# Patient Record
Sex: Male | Born: 1955 | Race: White | Hispanic: No | Marital: Married | Smoking: Never smoker
Health system: Southern US, Community
[De-identification: ages and names within clinical notes are randomized; demographics above are authoritative.]

## PROBLEM LIST (undated history)

## (undated) ENCOUNTER — Emergency Department (HOSPITAL_COMMUNITY): Discharge status: Against Medical Advice | Payer: Managed Care, Other (non HMO)

## (undated) DIAGNOSIS — IMO0002 Reserved for concepts with insufficient information to code with codable children: Secondary | ICD-10-CM

## (undated) DIAGNOSIS — E785 Hyperlipidemia, unspecified: Secondary | ICD-10-CM

## (undated) DIAGNOSIS — K469 Unspecified abdominal hernia without obstruction or gangrene: Secondary | ICD-10-CM

## (undated) DIAGNOSIS — G4733 Obstructive sleep apnea (adult) (pediatric): Secondary | ICD-10-CM

## (undated) DIAGNOSIS — C801 Malignant (primary) neoplasm, unspecified: Secondary | ICD-10-CM

## (undated) HISTORY — DX: Reserved for concepts with insufficient information to code with codable children: IMO0002

## (undated) HISTORY — DX: Hyperlipidemia, unspecified: E78.5

## (undated) HISTORY — DX: Unspecified abdominal hernia without obstruction or gangrene: K46.9

## (undated) HISTORY — DX: Malignant (primary) neoplasm, unspecified: C80.1

## (undated) HISTORY — DX: Obstructive sleep apnea (adult) (pediatric): G47.33

---

## 2002-03-26 ENCOUNTER — Ambulatory Visit (HOSPITAL_COMMUNITY): Admission: RE | Admit: 2002-03-26 | Discharge: 2002-03-26 | Payer: Self-pay | Admitting: Gastroenterology

## 2002-03-31 ENCOUNTER — Ambulatory Visit (HOSPITAL_COMMUNITY): Admission: RE | Admit: 2002-03-31 | Discharge: 2002-03-31 | Payer: Self-pay | Admitting: Gastroenterology

## 2003-12-03 ENCOUNTER — Encounter: Admission: RE | Admit: 2003-12-03 | Discharge: 2003-12-03 | Payer: Self-pay | Admitting: Internal Medicine

## 2008-08-20 HISTORY — PX: APPENDECTOMY: SHX54

## 2008-09-22 ENCOUNTER — Ambulatory Visit (HOSPITAL_COMMUNITY): Admission: RE | Admit: 2008-09-22 | Discharge: 2008-09-23 | Payer: Self-pay | Admitting: General Surgery

## 2008-09-22 ENCOUNTER — Encounter (INDEPENDENT_AMBULATORY_CARE_PROVIDER_SITE_OTHER): Payer: Self-pay | Admitting: General Surgery

## 2008-09-22 ENCOUNTER — Encounter: Admission: RE | Admit: 2008-09-22 | Discharge: 2008-09-22 | Payer: Self-pay | Admitting: Internal Medicine

## 2009-01-27 ENCOUNTER — Ambulatory Visit: Payer: Self-pay | Admitting: Sports Medicine

## 2009-01-27 DIAGNOSIS — M545 Low back pain, unspecified: Secondary | ICD-10-CM | POA: Insufficient documentation

## 2009-01-27 DIAGNOSIS — K279 Peptic ulcer, site unspecified, unspecified as acute or chronic, without hemorrhage or perforation: Secondary | ICD-10-CM | POA: Insufficient documentation

## 2009-01-27 DIAGNOSIS — E785 Hyperlipidemia, unspecified: Secondary | ICD-10-CM | POA: Insufficient documentation

## 2010-01-05 ENCOUNTER — Emergency Department (HOSPITAL_COMMUNITY): Admission: EM | Admit: 2010-01-05 | Discharge: 2010-01-05 | Payer: Self-pay | Admitting: Emergency Medicine

## 2010-08-20 DIAGNOSIS — C801 Malignant (primary) neoplasm, unspecified: Secondary | ICD-10-CM

## 2010-08-20 HISTORY — DX: Malignant (primary) neoplasm, unspecified: C80.1

## 2010-10-02 ENCOUNTER — Other Ambulatory Visit: Payer: Self-pay | Admitting: Surgery

## 2010-11-06 LAB — BASIC METABOLIC PANEL
Chloride: 104 mEq/L (ref 96–112)
Creatinine, Ser: 0.95 mg/dL (ref 0.4–1.5)
GFR calc Af Amer: >60 mL/min (ref >60)
GFR calc non Af Amer: >60 mL/min (ref >60)
Potassium: 3.9 mEq/L (ref 3.5–5.1)

## 2010-11-06 LAB — TROPONIN I: Troponin I: 0.03 ng/mL (ref 0.00–0.06)

## 2010-11-06 LAB — DIFFERENTIAL
Basophils Relative: 1 % (ref 0–1)
Lymphocytes Relative: 23 % (ref 12–46)
Monocytes Absolute: 0.4 10*3/uL (ref 0.1–1.0)
Monocytes Relative: 5 % (ref 3–12)
Neutro Abs: 5.4 10*3/uL (ref 1.7–7.7)
Neutrophils Relative %: 68 % (ref 43–77)

## 2010-11-06 LAB — CBC
Platelets: 161 10*3/uL (ref 150–400)
WBC: 7.9 10*3/uL (ref 4.0–10.5)

## 2010-11-06 LAB — CK: Total CK: 119 U/L (ref 7–232)

## 2011-01-02 NOTE — Op Note (Signed)
Jacob Lozano, Jacob Lozano              ACCOUNT NO.:  [id number]   MEDICAL RECORD NO.:  [id number]          PATIENT TYPE:  OIB   LOCATION:  1534                         FACILITY:  [facility]   PHYSICIAN:  Almond Lint, MD       DATE OF BIRTH:  1955-11-23   DATE OF PROCEDURE:  09/22/2008  DATE OF DISCHARGE:                               OPERATIVE REPORT   PREOPERATIVE DIAGNOSES:  Acute appendicitis and malrotation.   POSTOPERATIVE DIAGNOSES:  Acute appendicitis and malrotation.   OPERATIVE PROCEDURE:  Laparoscopy appendectomy and diagnostic  laparoscopy.   SURGEON:  Almond Lint, MD   ASSISTANT:  Currie Paris, M.D.   ANESTHESIA:  General anesthesia.   FINDINGS:  Gangrenous appendicitis with no perforation.  Malrotation was  seen but no Ladd bands were present.  The small bowel was all on the  right side of the abdomen.  The cecum and transverse colon were on the  left and midline in the abdomen.  The appendix was sent to pathology.   ESTIMATED BLOOD LOSS:  Minimal.   COMPLICATIONS:  None known.   DESCRIPTION OF PROCEDURE:  The patient was identified in the holding  area and taken to the operating room where he was place supine on the  operating room table.  General anesthesia was induced.  His arms were  tucked and his abdomen was prepped and draped in a sterile fashion after  a Foley catheter was placed.  Time out was performed according to the  surgical safety check list.  When all was correct we continued.  The  supraumbilical skin was anesthetized with a mixture of 1% lidocaine  plain and 0.25% Marcaine with epinephrine.  A vertical incision was made  in the midline just above the umbilicus.  The subcutaneous tissue was  spread with a hemostat and a Kocher clamp was used to elevate the  fascia.  The fascia was incised at the midline and a Kocher was placed  on either side of the midline fascia.  The hemostat was used to confirm  entrance in the abdomen and then a 0 Vicryl  was used to place a  pursestring around the fascial incision.  Hasson trocar was advanced in  the abdomen and pneumoperitoneum was achieved.  The camera was advanced  in the abdomen and the appendix was not immediately seen.  The patient  was placed in Trendelenburg and then it was apparent the appendix was  inflamed and in the midline just caudal to the umbilicus.  A 5 mm port  was placed under direct visualization in the standard fashion in the  left lower quadrant.  Additional port was placed similarly in the right  upper quadrant.  The tip of the appendix was grasped with the grasper  and elevated.  The terminal ileum and base of the cecum were located and  the inflammatory adhesions were bluntly dissected gently off of the  appendix and the mesoappendix.  Once this was done the mesoappendix was  divided with the Harmonic scalpel.  When the base of the appendix was  exposed at the cecum, the camera  was switched out to the left lower  quadrant and an Endo-GIA was used to divide the base of he appendix.  This was hen placed in an EndoCatch bag and removed from the abdomen.  In order to remove it from the abdomen the fascial incision had to be  extended superiorly to make room for the appendix to be removed.  The  pneumoperitoneum was reachieved once the trocar was placed back in the  abdomen and the pelvis and appendiceal stump were irrigated.  There was  no bleeding seen.  The attention was then directed to the malrotation.  Glassman retractors were used to visualize the small bowel.  The small  bowel was noted to all be on the right side of the abdomen and there  were no Ladd bands from the cecum to the duodenum.  The left upper  quadrant was examined as well and the splenic flexure was seen to be in  the normal location and the cecum was coming down with the omentum stuck  to it, just to the medial aspect of the sigmoid.  The cecum was adhesed  to the sigmoid.  Because there were no  Ladd bands to divide and the  bowel was already separated in the fashion that they would be if a  Ladd's procedure wasperformed, there was no additional dissection  performed.  The 5 mm ports were then removed under direct visualization  with no bleeding seen.  The Hasson was then removed from the abdomen  after allowing the pneumoperitoneum to evacuate.  The pursestring suture  was then closed and an additional defect was noted just superior to the  original pursestring.  The UR6 was hen used to close this defect.  All  the skin incisions were closed using 4-0 Monocryl in a subcuticular  fashion and the abdomen was cleaned and dried and dressed with  Dermabond.  The patient was awakened from anesthesia and taken to PACU  in stable condition.      Almond Lint, MD  Electronically Signed     FB/MEDQ  D:  09/22/2008  T:  09/23/2008  Job:  [id number]

## 2011-01-02 NOTE — H&P (Signed)
NAMETRAMEL, WESTBROOK              ACCOUNT NO.:  [id number]   MEDICAL RECORD NO.:  [id number]          PATIENT TYPE:  OIB   LOCATION:  0098                         FACILITY:  [facility]   PHYSICIAN:  Almond Lint, MD       DATE OF BIRTH:  [birth year]   DATE OF ADMISSION:  09/22/2008  DATE OF DISCHARGE:                              HISTORY & PHYSICAL   CHIEF COMPLAINT:  Abdominal pain.   HISTORY OF PRESENT ILLNESS:  Mr. Arzuaga is a 55 year old male who has  had approximately 24 hours of severe abdominal discomfort.  This started  in the mid abdomen near his umbilicus and continued to worsen.  He has  been able to sleep.  He tried to go to work this morning but was so  uncomfortable that he had to leave and sought care at his primary care  physician.  They performed a CT scan based on his symptoms of continued  pain with a differential diagnosis of appendicitis versus diverticulitis  and he was seen to have appendicitis in the mid abdomen.  He has pain  that is increased with movement and with standing straight.  He denies  nausea, vomiting, or fevers and chills.  He also denies diarrhea.   PAST MEDICAL HISTORY:  1. Significant for history of ulcers.  2. Hypercholesterolemia.  3. He also has a history of malrotation.   PAST SURGICAL HISTORY:  Negative.   ALLERGIES:  None.   FAMILY HISTORY:  Father has hypertension and coronary artery disease.   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  He  denies smoking and drinks about 4-5 beers a week.   REVIEW OF SYSTEMS:  Otherwise negative x11 systems.   PHYSICAL EXAMINATION:  VITAL SIGNS:  Temperature is 97.8, pulse 75,  respiratory rate 18, blood pressure 141/90, O2 saturations are 98% on  room air.  GENERAL:  Alert and oriented x3 in no acute distress.  HEENT:  Normocephalic, atraumatic.  Pupils equal, round, and reactive to  light.  NECK:  Supple.  HEART:  Regular.  ABDOMEN:  Soft, nondistended, tender in the  mid abdomen midway between  the umbilicus and pubis.  SKIN:  Flushed.   LABORATORY DATA:  White count is 13.2.  Chemistries:  Glucose is  slightly elevated at 128, creatinine of note is 0.8.  He has trace  protein in his urine.   CT scan is positive for acute appendicitis sent for malrotation.   ASSESSMENT:  Mr. Klasen is a 55 year old male with acute appendicitis  based on history and physical examination and laboratory and radiologic  findings.  We will take him to the operating room and perform  laparoscopic appendectomy.  Given his history of malrotation we will  also perform a Ladd's procedure if needed.  He was consented for the  operation and described risks of bleeding, infection, risk of damage to  adjacent structures, risk of needing to convert to an open surgery and  also risk of need for further operations or procedures.  We also placed  him on IV fluids and gave him  IV antibiotics.      Almond Lint, MD  Electronically Signed     FB/MEDQ  D:  09/22/2008  T:  09/22/2008  Job:  [id number]

## 2011-01-30 ENCOUNTER — Other Ambulatory Visit (INDEPENDENT_AMBULATORY_CARE_PROVIDER_SITE_OTHER): Payer: Self-pay | Admitting: General Surgery

## 2011-01-30 DIAGNOSIS — R102 Pelvic and perineal pain unspecified side: Secondary | ICD-10-CM

## 2011-01-30 DIAGNOSIS — R609 Edema, unspecified: Secondary | ICD-10-CM

## 2011-02-02 ENCOUNTER — Ambulatory Visit
Admission: RE | Admit: 2011-02-02 | Discharge: 2011-02-02 | Disposition: A | Discharge status: Home / Self Care | Payer: Managed Care, Other (non HMO) | Source: Ambulatory Visit | Attending: General Surgery | Admitting: General Surgery

## 2011-02-02 DIAGNOSIS — R102 Pelvic and perineal pain: Secondary | ICD-10-CM

## 2011-02-02 MED ORDER — IOHEXOL 300 MG/ML  SOLN
125.0000 mL | Freq: Once | INTRAMUSCULAR | Status: AC | PRN
  Administered 2011-02-02: 125 mL via INTRAVENOUS

## 2011-03-05 ENCOUNTER — Encounter (INDEPENDENT_AMBULATORY_CARE_PROVIDER_SITE_OTHER): Payer: Self-pay | Admitting: Surgery

## 2011-03-05 ENCOUNTER — Ambulatory Visit (INDEPENDENT_AMBULATORY_CARE_PROVIDER_SITE_OTHER): Payer: Managed Care, Other (non HMO) | Admitting: Surgery

## 2011-03-05 VITALS — BP 122/72 | HR 70 | Ht 73.0 in | Wt 186.0 lb

## 2011-03-05 DIAGNOSIS — K458 Other specified abdominal hernia without obstruction or gangrene: Secondary | ICD-10-CM | POA: Insufficient documentation

## 2011-03-05 NOTE — Progress Notes (Signed)
Subjective:     Patient ID: Jacob Lozano, male   DOB: 06/04/1956, 55 y.o.   MRN: [id number]  HPI  [name] comes today for a another opinion. He saw Dr. Donell Beers. He noted a left groin/perineal swelling after a cycling long time in May 2012. He saw Dr. Donell Beers. She had concern of an obturator hernia. He had CAT scan done. It was negative.   He notes that the pain & swelling is still there. He gets worse with activity. It can often double in size. It'll then gradually go down. No history of falls. No trauma. He's avoided any cycling but the problem still persists. It gets worse with activity.  Review of Systems  Constitutional: Negative for fever, chills, diaphoresis, activity change, appetite change and fatigue.  HENT: Negative for nosebleeds, sore throat, facial swelling, mouth sores, trouble swallowing and ear discharge.   Eyes: Negative for photophobia, discharge and visual disturbance.  Respiratory: Negative for choking, chest tightness, shortness of breath and stridor.   Cardiovascular: Negative for chest pain and palpitations.  Gastrointestinal: Negative for nausea, vomiting, abdominal pain, diarrhea, constipation, blood in stool, abdominal distention, anal bleeding and rectal pain.  Genitourinary: Negative for dysuria, urgency, difficulty urinating and testicular pain.  Musculoskeletal: Negative for myalgias, back pain, arthralgias and gait problem.  Skin: Negative for color change, pallor, rash and wound.  Neurological: Negative for dizziness, speech difficulty, weakness, numbness and headaches.  Hematological: Negative for adenopathy. Does not bruise/bleed easily.  Psychiatric/Behavioral: Negative for hallucinations, confusion and agitation.       Objective:   Physical Exam  Constitutional: He is oriented to person, place, and time. He appears well-developed and well-nourished. No distress.  HENT:  Head: Normocephalic.  Mouth/Throat: Oropharynx is clear and moist. No  oropharyngeal exudate.  Eyes: Conjunctivae and EOM are normal. Pupils are equal, round, and reactive to light. No scleral icterus.  Neck: Normal range of motion. Neck supple. No tracheal deviation present.  Cardiovascular: Normal rate, regular rhythm and intact distal pulses.   Pulmonary/Chest: Effort normal and breath sounds normal. No respiratory distress.  Abdominal: Soft. He exhibits no distension. There is no tenderness. Hernia confirmed negative in the right inguinal area and confirmed negative in the left inguinal area.  Genitourinary: Penis normal. No penile tenderness.       Left perineal swelling at anterior gluteus - mild.  ?Obturator hernia  Left femoral impulse ?hernia  Musculoskeletal: Normal range of motion. He exhibits no tenderness.  Lymphadenopathy:    He has no cervical adenopathy.       Right: No inguinal adenopathy present.       Left: No inguinal adenopathy present.  Neurological: He is alert and oriented to person, place, and time. No cranial nerve deficit. He exhibits normal muscle tone. Coordination normal.  Skin: Skin is warm and dry. No rash noted. He is not diaphoretic. No erythema. No pallor.  Psychiatric: He has a normal mood and affect. His behavior is normal. Judgment and thought content normal.       Assessment:     Left perineal swelling. Possible cyst. Possible obturator hernia. Possible femoral hernia as well.    Plan:     The history is suspicious for an obturator hernia. It's a little unusual in his age. However I do feel an impulse with that. Possible femoral hernia as well. Masses not resolved with decreased activity. It does not seem classic for cyst. It is too far away to be anything perianal.  I offered several  options. He's tried to decrease activity and try to control symptoms and has not worked.  Operative diagnostic laparoscopy. If he has a hernia obturator/femoral I can repair this with mesh. The technique risks benefits and alternatives  discussed. A possibility of a negative evaluation was noted as well.  Its as completely normal and I would do a cutdown on the mass to see if it's an inflamed cyst or something else abnormal. He and his wife wish surgery.

## 2011-03-05 NOTE — Patient Instructions (Signed)
See handout   Try NSAIDS (naproxen 2 pills 2x/day)  Activity as tolerated

## 2011-03-22 DIAGNOSIS — K458 Other specified abdominal hernia without obstruction or gangrene: Secondary | ICD-10-CM

## 2011-03-22 DIAGNOSIS — K4120 Bilateral femoral hernia, without obstruction or gangrene, not specified as recurrent: Secondary | ICD-10-CM

## 2011-03-22 HISTORY — PX: HERNIA REPAIR: SHX51

## 2011-03-26 ENCOUNTER — Ambulatory Visit (INDEPENDENT_AMBULATORY_CARE_PROVIDER_SITE_OTHER): Payer: Managed Care, Other (non HMO) | Admitting: General Surgery

## 2011-03-26 DIAGNOSIS — Z5189 Encounter for other specified aftercare: Secondary | ICD-10-CM

## 2011-03-26 NOTE — Progress Notes (Signed)
Pt came in to have his inquinal area checked after hernia surgery last Thursday. . Pt was afraid he had done something to cause some swelling in incision// there was some minimal swelling but no redness  Or drainage.  His scrotum and penis was black and blue but i tried to reassure pt this is normal sometimes after this type surgery// he was instructed to use cold pack/ prop legs when sitting and not in scunch position/ and to call if notices drainage / much more swelling /or had trouble urinating/ post op appt made with dr [phone] Matthias Hughs

## 2011-03-26 NOTE — Progress Notes (Signed)
Subjective:     Patient ID: Jacob Lozano, male   DOB: 21-Jul-1956, 55 y.o.   MRN: [id number]  HPI   Review of Systems     Objective:   Physical Exam     Assessment:     ***    Plan:     ***

## 2011-03-28 ENCOUNTER — Telehealth (INDEPENDENT_AMBULATORY_CARE_PROVIDER_SITE_OTHER): Payer: Self-pay

## 2011-03-28 NOTE — Telephone Encounter (Signed)
Returned pt's voicemail about him being concerned with the hernia operation. I told pt to use heat, advil or ibuprofen, and to take it easy till his appt next wk with Dr Michaell Cowing [facility]

## 2011-04-02 ENCOUNTER — Encounter (INDEPENDENT_AMBULATORY_CARE_PROVIDER_SITE_OTHER): Payer: Self-pay

## 2011-04-02 ENCOUNTER — Telehealth (INDEPENDENT_AMBULATORY_CARE_PROVIDER_SITE_OTHER): Payer: Self-pay

## 2011-04-02 NOTE — Telephone Encounter (Signed)
Returned pt's voicemail about needing a note to return to work this wk for 4-6 hrs a day until pt see's Dr Michaell Cowing on 04-05-11 to discuss the surgery that was done 03-22-11./ [facility]

## 2011-04-05 ENCOUNTER — Ambulatory Visit (INDEPENDENT_AMBULATORY_CARE_PROVIDER_SITE_OTHER): Payer: Managed Care, Other (non HMO) | Admitting: Surgery

## 2011-04-05 ENCOUNTER — Encounter (INDEPENDENT_AMBULATORY_CARE_PROVIDER_SITE_OTHER): Payer: Self-pay | Admitting: Surgery

## 2011-04-05 VITALS — BP 102/84 | HR 60 | Temp 96.8°F | Ht 73.0 in | Wt 182.2 lb

## 2011-04-05 DIAGNOSIS — K4120 Bilateral femoral hernia, without obstruction or gangrene, not specified as recurrent: Secondary | ICD-10-CM

## 2011-04-05 DIAGNOSIS — K458 Other specified abdominal hernia without obstruction or gangrene: Secondary | ICD-10-CM

## 2011-04-05 NOTE — Progress Notes (Signed)
Subjective:     Patient ID: Jacob Lozano, male   DOB: 04/03/1956, 55 y.o.   MRN: [id number]  HPI  Reason for visit: Followup on repairs of the femoral and obturator herniae on 03/22/2011  The patient notes that the pain & swelling is improving.  The patient notes a struggle constipation for she does better. He had a moderate amount of bruising but that's nearly resolved. He is back to working part-time. He pain notes it is sore to sit, however, he uses a gel doughnut and that does help. He is doing little bit of walking but is afraid to try any cycling. He is worried about getting back to full intensive activity. He feels a lump in the left inner groin and wondered if that is normal.  He only took 3 oxycodone's and now is not taking any pain medicine. He notes pain is not too bad unless she turns or twists a little bit. His appetite is down but gradually improving. His energy level is been rather down but seems to be getting better. Overall he thinks he is doing rather well for only a few weeks out but is concerned.  Review of Systems  Constitutional: Negative for fever, chills, diaphoresis, activity change and fatigue.  HENT: Negative for nosebleeds, sore throat, facial swelling, mouth sores, trouble swallowing and ear discharge.   Eyes: Negative for photophobia, discharge and visual disturbance.  Respiratory: Negative for choking, chest tightness, shortness of breath and stridor.   Cardiovascular: Negative for chest pain and palpitations.  Gastrointestinal: Negative for nausea, vomiting, abdominal pain, diarrhea, constipation, blood in stool, abdominal distention, anal bleeding and rectal pain.  Genitourinary: Negative for dysuria, urgency, frequency, difficulty urinating and testicular pain.  Musculoskeletal: Negative for myalgias, back pain, arthralgias and gait problem.  Skin: Negative for color change, pallor, rash and wound.  Neurological: Negative for dizziness, speech difficulty,  weakness, numbness and headaches.  Hematological: Negative for adenopathy. Does not bruise/bleed easily.  Psychiatric/Behavioral: Negative for hallucinations, confusion and agitation.       Objective:   Physical Exam  Constitutional: He is oriented to person, place, and time. He appears well-developed and well-nourished. No distress.  HENT:  Head: Normocephalic.  Mouth/Throat: Oropharynx is clear and moist. No oropharyngeal exudate.  Eyes: Conjunctivae and EOM are normal. Pupils are equal, round, and reactive to light. No scleral icterus.  Neck: Normal range of motion. Neck supple. No tracheal deviation present.  Cardiovascular: Normal rate, regular rhythm and intact distal pulses.   Pulmonary/Chest: Effort normal and breath sounds normal. No respiratory distress.  Abdominal: Soft. He exhibits no distension. There is no tenderness. Hernia confirmed negative in the right inguinal area and confirmed negative in the left inguinal area.       Lap incisions well healed  Genitourinary: Penis normal. No penile tenderness.       Left perineal swelling at anterior gluteus - mild normal healing ridge at incison.  Minimal ecchymosis    Musculoskeletal: Normal range of motion. He exhibits no tenderness.  Lymphadenopathy:    He has no cervical adenopathy.       Right: No inguinal adenopathy present.       Left: No inguinal adenopathy present.  Neurological: He is alert and oriented to person, place, and time. No cranial nerve deficit. He exhibits normal muscle tone. Coordination normal.  Skin: Skin is warm and dry. No rash noted. He is not diaphoretic. No erythema. No pallor.  Psychiatric: His behavior is normal. Judgment and thought content  normal.       Initially sad, but perked up & smiling at end.       Assessment:     S/p Bilateral obturator herniae & femoral herniae.    Plan:     For only 2 weeks out from and repair his hernias in 4 locations, I think he is doing rather well. I did  notes a swelling at the old obturator hernia with a sac was removed is normal. It is mild. He will gradually resolve over the next month.  Activity as tolerated. He can lift more than just a 5 pound lifting that he is doing. Consider a trial of nonsteroidals such as Aleve to help decrease soreness and allow him to do more activity. Eventually, he should be able to do unrestricted activity including his intensive cycling regimen.  I recommended he try oral supplemental shakes until his appetite comes back. I noted energy levels can be slow to come back and exercise and nutritional gradually helped improve. The is reasonable go up from part-time to regular work hours next week but it's will too soon right now. He felt reassured. He seemed perk up and was more energetic at the end of the visit. He is here today with his wife. I she feels reassured as well. They can follow up p.r.n. I did note if things do not get better and it worsened call me sooner

## 2011-05-14 ENCOUNTER — Other Ambulatory Visit: Payer: Self-pay | Admitting: Dermatology

## 2011-05-21 ENCOUNTER — Ambulatory Visit (INDEPENDENT_AMBULATORY_CARE_PROVIDER_SITE_OTHER): Payer: Managed Care, Other (non HMO) | Admitting: Surgery

## 2011-05-21 ENCOUNTER — Encounter (INDEPENDENT_AMBULATORY_CARE_PROVIDER_SITE_OTHER): Payer: Self-pay | Admitting: Surgery

## 2011-05-21 VITALS — BP 128/88 | HR 60 | Temp 96.5°F | Resp 16 | Ht 73.0 in | Wt 190.5 lb

## 2011-05-21 DIAGNOSIS — R102 Pelvic and perineal pain: Secondary | ICD-10-CM

## 2011-05-21 DIAGNOSIS — N509 Disorder of male genital organs, unspecified: Secondary | ICD-10-CM
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
## 2011-06-11 ENCOUNTER — Telehealth (INDEPENDENT_AMBULATORY_CARE_PROVIDER_SITE_OTHER): Payer: Self-pay

## 2011-06-11 NOTE — Telephone Encounter (Signed)
Patient called stating he's still having swelling & discomfort, states a CT scan was discussed at his last office visit and he would like to see when this could be scheduled.

## 2011-06-14 ENCOUNTER — Telehealth (INDEPENDENT_AMBULATORY_CARE_PROVIDER_SITE_OTHER): Payer: Self-pay

## 2011-06-14 DIAGNOSIS — Z8719 Personal history of other diseases of the digestive system: Secondary | ICD-10-CM

## 2011-06-14 NOTE — Telephone Encounter (Signed)
Returned pt's voicemail about still being in pain after hernia repair. I did speak with Dr Michaell Cowing about some recommendations for the pt to deal with his pain. Dr Michaell Cowing did advise that the pt need's to have no cycling for 3wks with no low impact activity either, pt needs to be on antinflamatories with heat for 3wks around the clock, and we will order a CT pelvis with IV contrast. I advised the pt depending on what the results will show of the CT pt might need some nerve blocks done in the office by Dr Michaell Cowing [facility]

## 2011-06-15 ENCOUNTER — Ambulatory Visit
Admission: RE | Admit: 2011-06-15 | Discharge: 2011-06-15 | Disposition: A | Discharge status: Home / Self Care | Payer: Managed Care, Other (non HMO) | Source: Ambulatory Visit | Attending: Surgery | Admitting: Surgery

## 2011-06-15 DIAGNOSIS — Z8719 Personal history of other diseases of the digestive system: Secondary | ICD-10-CM

## 2011-06-15 MED ORDER — IOHEXOL 300 MG/ML  SOLN
100.0000 mL | Freq: Once | INTRAMUSCULAR | Status: AC | PRN
  Administered 2011-06-15: 100 mL via INTRAVENOUS

## 2011-06-18 ENCOUNTER — Telehealth (INDEPENDENT_AMBULATORY_CARE_PROVIDER_SITE_OTHER): Payer: Self-pay

## 2011-06-18 NOTE — Telephone Encounter (Signed)
Pt notified of CT results showing no recurrent hernia or abscess per Dr Dwain Sarna. Pt asked if in a couple of wks if he is still having pain what to do and I advised to call for an appt.Hulda Humphrey

## 2011-08-08 ENCOUNTER — Emergency Department (HOSPITAL_COMMUNITY)
Admission: EM | Admit: 2011-08-08 | Discharge: 2011-08-08 | Disposition: A | Discharge status: Home / Self Care | Payer: Managed Care, Other (non HMO) | Attending: Emergency Medicine | Admitting: Emergency Medicine

## 2011-08-08 ENCOUNTER — Encounter (HOSPITAL_COMMUNITY): Payer: Self-pay | Admitting: Emergency Medicine

## 2011-08-08 DIAGNOSIS — T148XXA Other injury of unspecified body region, initial encounter: Secondary | ICD-10-CM | POA: Insufficient documentation

## 2011-08-08 DIAGNOSIS — M542 Cervicalgia: Secondary | ICD-10-CM | POA: Insufficient documentation

## 2011-08-08 DIAGNOSIS — R51 Headache: Secondary | ICD-10-CM | POA: Insufficient documentation

## 2011-08-08 DIAGNOSIS — M549 Dorsalgia, unspecified: Secondary | ICD-10-CM | POA: Insufficient documentation

## 2011-08-08 MED ORDER — METHOCARBAMOL 500 MG PO TABS: 1000.0000 mg | ORAL_TABLET | Freq: Four times a day (QID) | ORAL | Status: AC
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
## 2011-08-09 NOTE — ED Provider Notes (Signed)
Medical screening examination/treatment/procedure(s) were performed by non-physician practitioner and as supervising physician I was immediately available for consultation/collaboration.   Forbes Cellar, MD 08/09/11 [phone]

## 2011-10-01 ENCOUNTER — Ambulatory Visit (INDEPENDENT_AMBULATORY_CARE_PROVIDER_SITE_OTHER): Payer: Managed Care, Other (non HMO) | Admitting: Sports Medicine

## 2011-10-01 VITALS — BP 120/80 | Ht 73.0 in | Wt 185.0 lb

## 2011-10-01 DIAGNOSIS — R102 Pelvic and perineal pain unspecified side: Secondary | ICD-10-CM

## 2011-10-01 DIAGNOSIS — N509 Disorder of male genital organs, unspecified: Secondary | ICD-10-CM
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
## 2011-10-01 NOTE — Patient Instructions (Signed)
1. Do the butt squeeze, lower abdominal tightening, and butt tuck  5 times for a count of 5, once every hour.  2. Do leg raises and fire the abdomen muscle for a count of 5 each leg.  Start with one set of 10 and build to 3 sets of 10.  Start with body weight only, then progress to 2 lb and 5 lb weights. Increase weight after you can do 3 sets of 10 at the current weight.  Do this once daily.  3. Lateral leg raise, with butt squeeze and hold for a count of 5.  Start with one set of 10 and build to 3 sets of 10 daily.  4. Bridge with lower body squeeze, do a set of 5 and build up to set of 10.  When you can do a set of 10 build to 3 sets of 10.  5. Follow up with [place] in 6 weeks.  6. In the meantime please get a new seat for your bicycle.

## 2011-10-02 DIAGNOSIS — R102 Pelvic and perineal pain: Secondary | ICD-10-CM | POA: Insufficient documentation

## 2011-10-02 NOTE — Assessment & Plan Note (Signed)
We plan on treating this by improving the integrity of the pelvic floor with a HEP as outlined in the patient instructions.  He should be successful at home with these exercises based on his exam today.  If he is not we may consider formal physical therapy with biofeedback.  He will get a new seat for his bicycle which is specifically made to decrease pressure applied to the perineum.

## 2011-11-12 ENCOUNTER — Ambulatory Visit (INDEPENDENT_AMBULATORY_CARE_PROVIDER_SITE_OTHER): Payer: Managed Care, Other (non HMO) | Admitting: Sports Medicine

## 2011-11-12 ENCOUNTER — Encounter: Payer: Self-pay | Admitting: Sports Medicine

## 2011-11-12 VITALS — BP 136/84 | HR 58

## 2011-11-12 DIAGNOSIS — N509 Disorder of male genital organs, unspecified: Secondary | ICD-10-CM

## 2011-11-12 DIAGNOSIS — R102 Pelvic and perineal pain: Secondary | ICD-10-CM

## 2011-11-12 NOTE — Assessment & Plan Note (Signed)
Keep up exercise regimen  Try to incorporate into more daily activity  OK to keep riding  Once he finishes his 100 mile ride we will give him 12 weeks off and walking program and try to allow this to heal

## 2011-11-12 NOTE — Patient Instructions (Signed)
Please continue doing suggested home exercises daily.  It usually takes several months for this type of injury to heal.  Please follow up in June.   Look for persistently worse swelling or pain that does not improve over a week after weekend rides.  Thank you for seeing [place] today!

## 2011-11-12 NOTE — Progress Notes (Signed)
  Subjective:    Patient ID: Jacob Lozano, male    DOB: 07/21/1956, 56 y.o.   MRN: [id number]  HPI  Pt presents to clinic to f/u on pelvic floor muscle fascial tear which he does not feel has improved significantly.  Pain and swelling is located behind scrotum.  He has been increasing his mileage, and riding more aggressively to train for 100 mile ride in June. Did hard ride last weekend- was sore and swollen x3 days afterwards. Sits on ice frequently. Working with Systems analyst x2 days per week. Home exercises 50% of the time.   Has changed bike seat, and gotten new riding shorts.  While he still has same degree of pain he has upped his riding a lot and has not worsened   Review of Systems     Objective:   Physical Exam NAD  Able to contract voluntarily all pelvic floor mm and rectal MM and Rectus abdominis and External obliques  No swelling in perineal area Testicles with no swelling No hernias       Assessment & Plan:

## 2011-12-12 ENCOUNTER — Ambulatory Visit (INDEPENDENT_AMBULATORY_CARE_PROVIDER_SITE_OTHER): Payer: Managed Care, Other (non HMO) | Admitting: Sports Medicine

## 2011-12-12 VITALS — BP 132/85

## 2011-12-12 DIAGNOSIS — N509 Disorder of male genital organs, unspecified: Secondary | ICD-10-CM

## 2011-12-12 DIAGNOSIS — R102 Pelvic and perineal pain: Secondary | ICD-10-CM

## 2011-12-12 NOTE — Assessment & Plan Note (Signed)
C. his patient instructions  I think he should do formal physical therapy which may include modalities like biofeedback to see if he can get better healing and strengthening of his pelvic floor muscles.  We gave him 2 options and he is going to see if his insurance will cover being seen at either the physical therapy locations.

## 2011-12-12 NOTE — Progress Notes (Signed)
  Subjective:    Patient ID: Jacob Lozano, male    DOB: 1956-08-15, 56 y.o.   MRN: [id number]  HPI Patient returns for followup of a fascial tear in his perineum We gave him a series of perineal strengthening exercises He has been planning a long bike ride at the cancer fundraiser He did the exercises. He continued in boot camps.  He did a long ride of 59 miles. After long bike rides or vigorous exercise he continues to get a lot of perineal area pain and even some swelling just behind his testicles.  Today he comes in to consider other options and has decided he cannot get enough pain relief 2 continue on the vigorous training program for his bike rides.     Review of Systems     Objective:   Physical Exam No acute distress  We did not repeat prior assessment which is documented in last visit        Assessment & Plan:

## 2011-12-12 NOTE — Patient Instructions (Signed)
Rehabilitation plan for pelvic floor fascial injury  Avoid bike until we are sure OK to swim Try treadmill  Trial with PT to rehabilitate the pelvic floor  OK to do upper body weights in seated position - light weights  Avoid valsalva  OK to do easy sit-ups and crunches  Let's recheck after PT sessions are completed

## 2011-12-14 NOTE — Progress Notes (Signed)
Pt called stating PT with wilda at [facility]rology is not covered through patient's insurance.  He checked and [facility] is covered.  Pt referred to [facility] PT at [facility] per his request.

## 2011-12-14 NOTE — Progress Notes (Signed)
Addended by: Jacki Cones C on: 12/14/2011 11:07 AM   Modules accepted: Orders

## 2011-12-18 ENCOUNTER — Ambulatory Visit: Payer: Managed Care, Other (non HMO) | Admitting: Sports Medicine

## 2011-12-18 ENCOUNTER — Ambulatory Visit: Payer: Managed Care, Other (non HMO) | Attending: Sports Medicine | Admitting: Physical Therapy

## 2011-12-18 DIAGNOSIS — IMO0001 Reserved for inherently not codable concepts without codable children: Secondary | ICD-10-CM | POA: Insufficient documentation

## 2011-12-18 DIAGNOSIS — M2420 Disorder of ligament, unspecified site: Secondary | ICD-10-CM | POA: Insufficient documentation

## 2011-12-18 DIAGNOSIS — M629 Disorder of muscle, unspecified: Secondary | ICD-10-CM | POA: Insufficient documentation

## 2011-12-25 ENCOUNTER — Ambulatory Visit: Payer: Managed Care, Other (non HMO) | Attending: Sports Medicine | Admitting: Physical Therapy

## 2011-12-25 DIAGNOSIS — M629 Disorder of muscle, unspecified: Secondary | ICD-10-CM | POA: Insufficient documentation

## 2011-12-25 DIAGNOSIS — M2420 Disorder of ligament, unspecified site: Secondary | ICD-10-CM | POA: Insufficient documentation

## 2011-12-25 DIAGNOSIS — IMO0001 Reserved for inherently not codable concepts without codable children: Secondary | ICD-10-CM | POA: Insufficient documentation

## 2012-01-01 ENCOUNTER — Ambulatory Visit: Payer: Managed Care, Other (non HMO) | Admitting: Physical Therapy

## 2012-01-15 ENCOUNTER — Ambulatory Visit: Payer: Managed Care, Other (non HMO) | Admitting: Physical Therapy

## 2012-01-22 ENCOUNTER — Ambulatory Visit: Payer: Managed Care, Other (non HMO) | Attending: Internal Medicine | Admitting: Physical Therapy

## 2012-01-22 DIAGNOSIS — M629 Disorder of muscle, unspecified: Secondary | ICD-10-CM | POA: Insufficient documentation

## 2012-01-22 DIAGNOSIS — M2420 Disorder of ligament, unspecified site: Secondary | ICD-10-CM | POA: Insufficient documentation

## 2012-01-22 DIAGNOSIS — IMO0001 Reserved for inherently not codable concepts without codable children: Secondary | ICD-10-CM | POA: Insufficient documentation

## 2012-01-28 ENCOUNTER — Ambulatory Visit: Payer: Managed Care, Other (non HMO) | Admitting: Sports Medicine

## 2012-01-29 ENCOUNTER — Ambulatory Visit: Payer: Managed Care, Other (non HMO) | Admitting: Physical Therapy

## 2012-01-30 ENCOUNTER — Ambulatory Visit: Payer: Managed Care, Other (non HMO) | Admitting: Sports Medicine

## 2012-02-05 ENCOUNTER — Ambulatory Visit: Payer: Managed Care, Other (non HMO) | Admitting: Physical Therapy

## 2012-02-12 ENCOUNTER — Ambulatory Visit: Payer: Managed Care, Other (non HMO) | Admitting: Physical Therapy

## 2012-02-19 ENCOUNTER — Ambulatory Visit: Payer: Managed Care, Other (non HMO) | Attending: Internal Medicine | Admitting: Physical Therapy

## 2012-02-19 DIAGNOSIS — M629 Disorder of muscle, unspecified: Secondary | ICD-10-CM | POA: Insufficient documentation

## 2012-02-19 DIAGNOSIS — IMO0001 Reserved for inherently not codable concepts without codable children: Secondary | ICD-10-CM | POA: Insufficient documentation

## 2012-02-19 DIAGNOSIS — M2420 Disorder of ligament, unspecified site: Secondary | ICD-10-CM | POA: Insufficient documentation

## 2012-02-20 ENCOUNTER — Ambulatory Visit (INDEPENDENT_AMBULATORY_CARE_PROVIDER_SITE_OTHER): Payer: Managed Care, Other (non HMO) | Admitting: Sports Medicine

## 2012-02-20 ENCOUNTER — Encounter: Payer: Self-pay | Admitting: Sports Medicine

## 2012-02-20 VITALS — BP 134/84 | HR 57

## 2012-02-20 DIAGNOSIS — R102 Pelvic and perineal pain: Secondary | ICD-10-CM

## 2012-02-20 DIAGNOSIS — N509 Disorder of male genital organs, unspecified: Secondary | ICD-10-CM

## 2012-02-20 NOTE — Progress Notes (Signed)
  Subjective:    Patient ID: Jacob Lozano, male    DOB: [birth year], 56 y.o.   MRN: [id number]  HPI  Patient returns afer 9 PT sessions for perineal fascial tear They recently added intra rectal pressure and massage This really helped break spasm  Still gets pain at end of day Some swelling behind scrotum only at day end  Able to walk up to 3.6 mi Avoiding bike or run for now  Not using meds  Review of Systems     Objective:   Physical Exam  NAD  Pelvic floor not examined today      Assessment & Plan:

## 2012-02-20 NOTE — Assessment & Plan Note (Signed)
Keep up PT for at least 3 seesions Now has 60% reduction in pain  Get a good HEP   Keep up walking  When pain down 80% we will gradually start running  Will be slow  Reck 6 wks

## 2012-02-26 ENCOUNTER — Ambulatory Visit: Payer: Managed Care, Other (non HMO) | Admitting: Physical Therapy

## 2012-03-04 ENCOUNTER — Ambulatory Visit: Payer: Managed Care, Other (non HMO) | Admitting: Physical Therapy

## 2012-03-13 ENCOUNTER — Ambulatory Visit: Payer: Managed Care, Other (non HMO) | Admitting: Physical Therapy

## 2012-04-03 ENCOUNTER — Ambulatory Visit (INDEPENDENT_AMBULATORY_CARE_PROVIDER_SITE_OTHER): Payer: Managed Care, Other (non HMO) | Admitting: Sports Medicine

## 2012-04-03 ENCOUNTER — Ambulatory Visit: Payer: Managed Care, Other (non HMO) | Admitting: Physical Therapy

## 2012-04-03 ENCOUNTER — Encounter: Payer: Self-pay | Admitting: Sports Medicine

## 2012-04-03 VITALS — BP 122/84 | HR 61 | Ht 73.0 in | Wt 186.0 lb

## 2012-04-03 DIAGNOSIS — R102 Pelvic and perineal pain: Secondary | ICD-10-CM

## 2012-04-03 DIAGNOSIS — N509 Disorder of male genital organs, unspecified: Secondary | ICD-10-CM

## 2012-04-03 NOTE — Assessment & Plan Note (Signed)
Doing well.  Plan to continue physical therapy weekly x6 weeks. Continue pelvic floor isolation/oscillation exercises. Continue abdominal and hip strengthening. Increased recumbent bicycling by 5 minutes per week. Limit activity to pain no more than  3/10. Followup in 6 weeks

## 2012-04-03 NOTE — Patient Instructions (Addendum)
Thank you for coming in today. Please continue those pelvic floor exercises.  3 pelvic floor isolation exercises per physical therapy.  While at your desk try to do 5x5 second pelvic floor firing.  Do two of the stretches the PT showed you to 15 seconds at a time at least 1x a day.  Also consider squeezing a ball between your knees with these exercises.  Continue the back exercises.  Butt squeeze, butt tuck, belly squeeze.  Continue walking, elliptical, swimming etc exercises.   We can continue the physical therapy every other week as you like.   Increase your time on the recumbent bike by 5 min per week.  Avoid activity that causes pain >3/10 Continue PT weekly x 6 weeks  Come on back in 6 weeks.
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
## 2012-04-08 ENCOUNTER — Ambulatory Visit: Payer: Managed Care, Other (non HMO) | Attending: Internal Medicine | Admitting: Physical Therapy

## 2012-04-08 DIAGNOSIS — M629 Disorder of muscle, unspecified: Secondary | ICD-10-CM | POA: Insufficient documentation

## 2012-04-08 DIAGNOSIS — M2420 Disorder of ligament, unspecified site: Secondary | ICD-10-CM | POA: Insufficient documentation

## 2012-04-08 DIAGNOSIS — IMO0001 Reserved for inherently not codable concepts without codable children: Secondary | ICD-10-CM | POA: Insufficient documentation

## 2012-04-17 ENCOUNTER — Ambulatory Visit: Payer: Managed Care, Other (non HMO) | Admitting: Physical Therapy

## 2012-04-24 ENCOUNTER — Ambulatory Visit: Payer: Managed Care, Other (non HMO) | Attending: Internal Medicine | Admitting: Physical Therapy

## 2012-04-24 DIAGNOSIS — IMO0001 Reserved for inherently not codable concepts without codable children: Secondary | ICD-10-CM | POA: Insufficient documentation

## 2012-04-24 DIAGNOSIS — M629 Disorder of muscle, unspecified: Secondary | ICD-10-CM | POA: Insufficient documentation

## 2012-04-24 DIAGNOSIS — M2420 Disorder of ligament, unspecified site: Secondary | ICD-10-CM | POA: Insufficient documentation

## 2012-04-29 ENCOUNTER — Ambulatory Visit: Payer: Managed Care, Other (non HMO) | Admitting: Physical Therapy

## 2012-05-06 ENCOUNTER — Ambulatory Visit: Payer: Managed Care, Other (non HMO) | Admitting: Physical Therapy

## 2012-05-13 ENCOUNTER — Ambulatory Visit: Payer: Managed Care, Other (non HMO) | Admitting: Physical Therapy

## 2012-05-14 ENCOUNTER — Ambulatory Visit (INDEPENDENT_AMBULATORY_CARE_PROVIDER_SITE_OTHER): Payer: Managed Care, Other (non HMO) | Admitting: Sports Medicine

## 2012-05-14 VITALS — BP 130/80 | Ht 73.0 in | Wt 186.0 lb

## 2012-05-14 DIAGNOSIS — N509 Disorder of male genital organs, unspecified: Secondary | ICD-10-CM

## 2012-05-14 DIAGNOSIS — R102 Pelvic and perineal pain unspecified side: Secondary | ICD-10-CM

## 2012-05-14 NOTE — Patient Instructions (Addendum)
Do side planks each side 3 sets, 5 seconds each Do back planks/back bridge 3 sets, 5 seconds each Butt roll during back bridge Butt squeeze during side plank Continue recumbent bike, you can add 5 minutes each week as tolerated Do spinning 5 minutes upright and increase 2-3 minutes each week as tolerated We will see you back in 3 months.

## 2012-05-14 NOTE — Progress Notes (Signed)
  Subjective:    Patient ID: Jacob Lozano, male    DOB: November 23, 1955, 56 y.o.   MRN: [id number]  HPI 1. F/u Perineal pain:  Pain is continuing to improve, although not 100% yet.  He has been discharged from PT and is continuing to work with a Systems analyst on core strengthening.  He has not gotten back to road biking yet but he does do ~20 minutes on recumbent bike and walking 3 miles.  Feels like area is swollen today after working with personal trainer last night.  He does use a small foam roller to stretch out and massage the perineal area.  He states he was told by PT that he may be back to biking in the spring.     Review of Systems Denies fever, chills, urinary or stool incontinence, numbness into legs or groin    Objective:   Physical Exam  Constitutional: He appears well-nourished. No distress.  Musculoskeletal:       Tenderness on L side just anterior to ischial tuberosity.   Hip adductors 5/5 and able to tense abdominal muscles and gluteal muscles independently without any problem.    Neurological: He is alert.          Assessment & Plan:

## 2012-05-14 NOTE — Assessment & Plan Note (Signed)
Improved with physical therapy, he is now working with a Systems analyst on core strengthening Will have him continue core exercises with addition of more side planks and back bridges. Will have him start spin bike for 5 minutes per day with increasing 2-3 minutes per week as tolerated Continue recumbent bike and increase in 5 minute intervals each week as tolerated Once doing 60 minutes on recumbent, can try light riding on road bike Folllow up with [place] in 3 months.

## 2012-08-02 ENCOUNTER — Emergency Department (HOSPITAL_COMMUNITY): Payer: Managed Care, Other (non HMO)

## 2012-08-02 ENCOUNTER — Encounter (HOSPITAL_COMMUNITY): Payer: Self-pay | Admitting: *Deleted

## 2012-08-02 ENCOUNTER — Emergency Department (HOSPITAL_COMMUNITY)
Admission: EM | Admit: 2012-08-02 | Discharge: 2012-08-03 | Disposition: A | Discharge status: Home / Self Care | Payer: Managed Care, Other (non HMO) | Attending: Emergency Medicine | Admitting: Emergency Medicine

## 2012-08-02 DIAGNOSIS — K219 Gastro-esophageal reflux disease without esophagitis: Secondary | ICD-10-CM | POA: Insufficient documentation

## 2012-08-02 DIAGNOSIS — Z8719 Personal history of other diseases of the digestive system: Secondary | ICD-10-CM | POA: Insufficient documentation

## 2012-08-02 DIAGNOSIS — Z85828 Personal history of other malignant neoplasm of skin: Secondary | ICD-10-CM | POA: Insufficient documentation

## 2012-08-02 DIAGNOSIS — K259 Gastric ulcer, unspecified as acute or chronic, without hemorrhage or perforation: Secondary | ICD-10-CM | POA: Insufficient documentation

## 2012-08-02 DIAGNOSIS — Z79899 Other long term (current) drug therapy: Secondary | ICD-10-CM | POA: Insufficient documentation

## 2012-08-02 LAB — CBC
HCT: 46.8 % (ref 39.0–52.0)
Hemoglobin: 16.3 g/dL (ref 13.0–17.0)
MCH: 33.4 pg (ref 26.0–34.0)
MCHC: 34.8 g/dL (ref 30.0–36.0)
MCV: 95.9 fL (ref 78.0–100.0)
RBC: 4.88 MIL/uL (ref 4.22–5.81)

## 2012-08-02 LAB — BASIC METABOLIC PANEL
BUN: 22 mg/dL (ref 6–23)
CO2: 25 mEq/L (ref 19–32)
Calcium: 8.9 mg/dL (ref 8.4–10.5)
Glucose, Bld: 147 mg/dL — ABNORMAL HIGH (ref 70–99)
Sodium: 134 mEq/L — ABNORMAL LOW (ref 135–145)

## 2012-08-02 LAB — POCT I-STAT TROPONIN I: Troponin i, poc: 0 ng/mL (ref 0.00–0.08)

## 2012-08-02 MED ORDER — KETOROLAC TROMETHAMINE 30 MG/ML IJ SOLN
30.0000 mg | Freq: Once | INTRAMUSCULAR | Status: AC
  Administered 2012-08-02: 30 mg via INTRAVENOUS
  Filled 2012-08-02: qty 1

## 2012-08-02 MED ORDER — MORPHINE SULFATE 4 MG/ML IJ SOLN
4.0000 mg | Freq: Once | INTRAMUSCULAR | Status: AC
  Administered 2012-08-02: 4 mg via INTRAVENOUS
  Filled 2012-08-02: qty 1

## 2012-08-02 MED ORDER — GI COCKTAIL ~~LOC~~
30.0000 mL | Freq: Once | ORAL | Status: AC
  Administered 2012-08-02: 30 mL via ORAL
  Filled 2012-08-02: qty 30

## 2012-08-02 MED ORDER — PANTOPRAZOLE SODIUM 40 MG IV SOLR
40.0000 mg | Freq: Once | INTRAVENOUS | Status: AC
  Administered 2012-08-02: 40 mg via INTRAVENOUS
  Filled 2012-08-02: qty 40

## 2012-08-02 MED ORDER — ONDANSETRON HCL 4 MG/2ML IJ SOLN
4.0000 mg | Freq: Once | INTRAMUSCULAR | Status: AC
  Administered 2012-08-02: 4 mg via INTRAVENOUS
  Filled 2012-08-02: qty 2

## 2012-08-02 MED ORDER — PANTOPRAZOLE SODIUM 40 MG IV SOLR
40.0000 mg | Freq: Once | INTRAVENOUS | Status: DC
  Filled 2012-08-02: qty 40

## 2012-08-02 MED ORDER — ASPIRIN 325 MG PO TABS
325.0000 mg | ORAL_TABLET | ORAL | Status: AC
  Administered 2012-08-02: 325 mg via ORAL
  Filled 2012-08-02: qty 1

## 2012-08-02 MED ORDER — FAMOTIDINE IN NACL 20-0.9 MG/50ML-% IV SOLN
20.0000 mg | Freq: Once | INTRAVENOUS | Status: AC
  Administered 2012-08-02: 20 mg via INTRAVENOUS
  Filled 2012-08-02: qty 50

## 2012-08-02 NOTE — ED Provider Notes (Signed)
Medical screening examination/treatment/procedure(s) were conducted as a shared visit with non-physician practitioner(s) and myself.  I personally evaluated the patient during the encounter  Symptoms atypical for cardiac etiology.  Started with GERD like symptoms.  Non specific EKG findings but overall low suspicion for ACS.  Will plan on 2 sets of cardiac markers.  If negative will dc home with antacids.  Celene Kras, MD 08/02/12 [phone]

## 2012-08-02 NOTE — ED Provider Notes (Signed)
History     CSN: [id number]  Arrival date & time 08/02/12  1919   First MD Initiated Contact with Patient 08/02/12 2000      Chief Complaint  Patient presents with  . Chest Pain  . Nausea    (Consider location/radiation/quality/duration/timing/severity/associated sxs/prior treatment) HPI History provided by pt.   Pt w/ h/o gastric ulcers and GERD, presents w/ c/o right-sided CP.  Woke at 2am w/ typical indigestion.  Improved w/ zantac at 5pm.  When this pain resolved, he noticed a pain in right-side of chest.  Gets a sharp, non-radiating pain, approx every 4-5 seconds.  No aggravating/alleviating factors.  No associated fever, cough, SOB, N/V, LE pain/edema.  RF for PE include recent travel.  No RF for ACS.   Past Medical History  Diagnosis Date  . 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 Ulcer   . Hernia     Past Surgical History  Procedure Date  . Appendectomy 2010  . Hernia repair 03/22/2011    Lap bilateral obturator & femoral hernia repairs    Family History  Problem Relation Age of Onset  . Heart disease Father   . Hypertension Father     History  Substance Use Topics  . Smoking status: Never Smoker   . Smokeless tobacco: Never Used  . Alcohol Use: Yes      Review of Systems  All other systems reviewed and are negative.    Allergies  Review of patient's allergies indicates no known allergies.  Home Medications   Current Outpatient Rx  Name  Route  Sig  Dispense  Refill  . ATORVASTATIN CALCIUM 20 MG PO TABS   Oral   Take 20 mg by mouth daily.         Marland Kitchen BISMUTH SUBSALICYLATE 262 MG PO CHEW   Oral   Chew 262 mg by mouth as needed. For upset stomach         . JUICE PLUS FIBRE PO   Oral   Take 3 capsules by mouth daily.         Marland Kitchen PANTOPRAZOLE SODIUM 40 MG PO TBEC   Oral   Take 40 mg by mouth daily.         Marland Kitchen RANITIDINE HCL 150 MG PO TABS   Oral   Take 150 mg by mouth 2 (two) times daily as needed.           BP 128/74  Pulse  85  Temp 99.1 F (37.3 C) (Oral)  Resp 16  SpO2 94%  Physical Exam  Nursing note and vitals reviewed. Constitutional: He is oriented to person, place, and time. He appears well-developed and well-nourished. No distress.  HENT:  Head: Normocephalic and atraumatic.  Eyes:       Normal appearance  Neck: Normal range of motion.  Cardiovascular: Normal rate, regular rhythm and intact distal pulses.   Pulmonary/Chest: Effort normal and breath sounds normal. No respiratory distress.       No pleuritic pain reported.  Mild tenderness right anterior chest.    Abdominal: Soft. Bowel sounds are normal. He exhibits no distension. There is no tenderness. There is no guarding.  Musculoskeletal: Normal range of motion.       No peripheral edema or calf tenderness  Neurological: He is alert and oriented to person, place, and time.  Skin: Skin is warm and dry. No rash noted.  Psychiatric: He has a normal mood and affect. His behavior is normal.  ED Course  Procedures (including critical care time)   Date: 08/02/2012  Rate: 82  Rhythm: normal sinus rhythm  QRS Axis: normal  Intervals: normal  ST/T Wave abnormalities: nonspecific T wave changes  Conduction Disutrbances:none  Narrative Interpretation:   Old EKG Reviewed: none available   Labs Reviewed  BASIC METABOLIC PANEL - Abnormal; Notable for the following:    Sodium 134 (*)     Potassium 3.4 (*)     Glucose, Bld 147 (*)     All other components within normal limits  CBC  D-DIMER, QUANTITATIVE  POCT I-STAT TROPONIN I  POCT I-STAT TROPONIN I   Dg Chest 2 View  08/02/2012  *RADIOLOGY REPORT*  Clinical Data: Chest pain with nausea and indigestion.  CHEST - 2 VIEW  Comparison: 01/05/2010  Findings: Low lung volumes accentuate the cardiac size which is within normal limits.  No infiltrates or failure.  No effusion or pneumothorax.  Normal hilar and mediastinal contours.  No osseous abnormality.  IMPRESSION: No active cardiopulmonary  disease.  Low lung volumes.  Little change from priors.   Original Report Authenticated By: Davonna Belling, M.D.      1. GERD (gastroesophageal reflux disease)       MDM  56yo M w/ h/o gastric ulcers and GERD, presents w/ intermittent, sharp, non-radiating right-sided chest pains since 5pm.  Feels different than indigestion.  No associated sx and no aggravating/alleviating factors.  Pain reproducible w/ palpation and ROM of RUE on exam.  No RF for ACS and pain atypical but non-specific t-wave changes on EKG w/out prior for comparison.  Sx are atypical for and there are no exam findings consistent w/ PE, but d-dimer ordered d/t recent travel.  Labs and CXR pending.  Pt to receive IV protonix and morphine, as well as GI cocktail.  8:22 PM   Pt reports that pain has improved.  D-dimer and troponin neg and labs otherwise unremarkable.  CXR neg.  Results discussed w/ pt and his wife.  I suspect that pain is musculoskeletal, such as costochondritis, or related to GERD.  Dr. Lynelle Doctor has seen and believes that the etiology is GI.  He recommends a second troponin.  9:59 PM   Second troponin neg.  Results discussed w/ pt.  D/c'd home w/ carafate to supplement his aciphex.  Recommended f/u with his PCP and return to ER for worsening sx.        Otilio Miu, PA-C 08/03/12 [phone]

## 2012-08-02 NOTE — ED Notes (Signed)
Pt states pain started at 2 am this morning and that it woke him up from sleep states he vomited in his mouth and he is having pulsating right sided chest pain with shortness of breath and the pain causes him to "wenz"  Pt denies any other symptoms. Pt is alert and oriented,  States he does drink alcohol and has been today

## 2012-08-03 MED ORDER — HYDROCODONE-ACETAMINOPHEN 5-325 MG PO TABS: 1.0000 | ORAL_TABLET | ORAL | Status: DC | PRN

## 2012-08-03 MED ORDER — SUCRALFATE 1 G PO TABS: 1.0000 g | ORAL_TABLET | Freq: Four times a day (QID) | ORAL | Status: DC

## 2012-08-03 MED ORDER — ONDANSETRON HCL 8 MG PO TABS: 8.0000 mg | ORAL_TABLET | Freq: Three times a day (TID) | ORAL | Status: DC | PRN

## 2012-08-05 ENCOUNTER — Encounter: Payer: Self-pay | Admitting: Sports Medicine

## 2012-08-05 ENCOUNTER — Ambulatory Visit (INDEPENDENT_AMBULATORY_CARE_PROVIDER_SITE_OTHER): Payer: Managed Care, Other (non HMO) | Admitting: Sports Medicine

## 2012-08-05 VITALS — BP 119/76 | HR 51 | Ht 73.0 in | Wt 186.0 lb

## 2012-08-05 DIAGNOSIS — N509 Disorder of male genital organs, unspecified: Secondary | ICD-10-CM

## 2012-08-05 DIAGNOSIS — R102 Pelvic and perineal pain unspecified side: Secondary | ICD-10-CM

## 2012-08-05 NOTE — Assessment & Plan Note (Addendum)
Although he does continue to have swelling in left groin region from his perineal fascial tear, he has minimal pain and his core muscle strength is strong. Increasing activity as tolerated and monitoring and adjusting activities based on pain seems appropriate at this time.  -Increase recumbent bike activity as outlined in AVS with the goal of getting on a road bike if he tolerates this -Ice as needed and continue to wear compression shorts and physical training/home exercises -Follow-up in 3 months

## 2012-08-05 NOTE — Patient Instructions (Addendum)
Increase recumbent bike as tolerated as outlined below: -Start with 45 minutes easy -If above tolerated, increase to 30 minutes moderate -If above tolerated, 45 minutes moderate -If above tolerated and comfortable, 45 minutes total with three 5 min segments at hard and other 30 minutes moderate -If above tolerated, then you may try road biking  Modify any activity that makes the pain worse  Ice as needed  Continue physical triaining Continue core exercises 3-4 times a week (isometric abs, butt squeezes, cheek rolls)  Follow-up in 3 months

## 2012-08-05 NOTE — Progress Notes (Signed)
  Subjective:    Patient ID: Jacob Lozano, male    DOB: Feb 16, 1956, 56 y.o.   MRN: [id number]  HPI He is here for follow-up of left perineal fascial tear.  His biggest complaint is persistent swelling. Occasionally, he experiences mild tenderness, but this is not significant. He reports doing his core strengthening exercises routinely, and he continues to work with a Systems analyst twice a week. He feels like his core is much stronger.  He uses a recumbent bike at an easy pace for about 30 minutes at a time. He denies significant increase in pain or swelling after activity.  He wears compression shorts most of the time. He notices an increase in swelling if he does not wear them.  He ices occasionally and thinks this does help the swelling some when he does.  He is feeling frustrated because he has not noticed a significant improvement in the swelling since May 2013, despite being faithful to his physical therapy regimen.   Review of Systems  Allergies, medication, past medical history reviewed.      Objective:   Physical Exam GEN: NAD; well-nourished, -appearing PERINEUM: mild tenderness left ischial spine  HIP:    Strength: 5/5 abduction, adduction, flexion, extension  Hamstring strength is also normal    Assessment & Plan:  Increase recumbent bike as tolerated as outlined below: -Start with 45 minutes easy -If above tolerated, increase to 30 minutes moderate -If above tolerated, 45 minutes moderate -If above tolerated and comfortable, 45 minutes total with three 5 min segments at hard and other 30 minutes moderate -If above tolerated, then you may try road biking  Modify any activity that makes the pain worse  Ice as needed  Continue physical triaining Continue core exercises 3-4 times a week (isometric abs, butt squeezes, cheek rolls)  Follow-up in 3 months

## 2012-11-04 ENCOUNTER — Ambulatory Visit (INDEPENDENT_AMBULATORY_CARE_PROVIDER_SITE_OTHER): Payer: Managed Care, Other (non HMO) | Admitting: Sports Medicine

## 2012-11-04 ENCOUNTER — Encounter: Payer: Self-pay | Admitting: Sports Medicine

## 2012-11-04 VITALS — BP 133/89 | HR 56 | Ht 73.0 in | Wt 186.0 lb

## 2012-11-04 DIAGNOSIS — N509 Disorder of male genital organs, unspecified: Secondary | ICD-10-CM

## 2012-11-04 DIAGNOSIS — R102 Pelvic and perineal pain: Secondary | ICD-10-CM

## 2012-11-04 NOTE — Assessment & Plan Note (Signed)
Most of current sxs seem consistent with scar tissue We will outline a plan to gradually increase his activity Keep biking to qod  See instructions Reck 3 to 4 mos

## 2012-11-04 NOTE — Progress Notes (Signed)
Patient ID: Jacob Lozano, male   DOB: October 06, 1955, 57 y.o.   MRN: [id number]  Saturday was the first time on a bike Felt injury area within 15 mins on mtn bike - not much worse and able to do 39 mins and ride 8.8 miles Had been up to 45 mins on elliptical Icing does help after he does this  Has a history of multiple hernia surgeries  Also running some with personal trainer  Exam  Patient is alert No limp or pain on walking Good abdominal tone Perineal exam deferred

## 2012-11-04 NOTE — Patient Instructions (Addendum)
Protocol for Rehab  Keep the mountain bike rides at 40 min level until you have done 5 of these If these go well add 5 mins to duration of rides Repeat the cycle Frequency every 3 days  Expect some sxs but I don't think this will recreate the injury  Stand periodically to change pressure pattern  Recumbent - add resistance on a specific pattern - for example if you are 45 minutes - 10 mins steady and 5 minutes faster Key is to increase speed more than resistance (80 RM is a good level to target)  Continue other core as that takes pressure off injury  Running - when possible hit softer surfaces/ be careful on down hills as that is highest impact\  Faythe Ghee is a good Systems analyst working with older athletes

## 2012-11-21 ENCOUNTER — Encounter: Payer: Self-pay | Admitting: Neurology

## 2013-02-11 ENCOUNTER — Encounter: Payer: Self-pay | Admitting: Sports Medicine

## 2013-02-11 ENCOUNTER — Ambulatory Visit (INDEPENDENT_AMBULATORY_CARE_PROVIDER_SITE_OTHER): Payer: Managed Care, Other (non HMO) | Admitting: Sports Medicine

## 2013-02-11 VITALS — BP 127/85 | HR 58 | Ht 73.0 in | Wt 184.0 lb

## 2013-02-11 DIAGNOSIS — N509 Disorder of male genital organs, unspecified: Secondary | ICD-10-CM

## 2013-02-11 DIAGNOSIS — R102 Pelvic and perineal pain: Secondary | ICD-10-CM

## 2013-02-11 NOTE — Assessment & Plan Note (Addendum)
Patient is continuing to improve slowly. Encouraged patient to continue exercising at least 3 times a week doing the peroneal exercises. Discuss strengthening gluteus maximus for more stability. Return to office in 3 months for further evaluation.

## 2013-02-11 NOTE — Progress Notes (Signed)
Patient returns today for perineum pain. Patient states that he has improved over time but still not perfect.  Patient has been wearing padded shorts and seat with moderate improvement. Patient is also cross training and does notice worsening pain with valsalva.  Patient has been able to increase his riding time to 45 minutes but when increasing intensity he starts to have more discomfort. Goal is to be able to ride for an hour.  Icing seems to help still. No pain meds are being used.  No new symptoms.   Past medical history, social, surgical and family history all reviewed.   Physical Exam Blood pressure 127/85, pulse 58, height 6\' 1"  (1.854 m), weight 184 lb (83.462 kg). General: No apparent distress alert and oriented x3 mood and affect normal, patient has lost a lot of weight and gain muscle mass since last visit.  Respiratory: Patient's speak in full sentences and does not appear short of breath Skin: Warm dry intact with no signs of infection or rash Neuro: Cranial nerves II through XII are intact, neurovascularly intact in all extremities with 2+ DTRs and 2+ pulses. No limp or pain on walking  Good abdominal tone  Perineal exam deferred

## 2013-02-11 NOTE — Patient Instructions (Addendum)
I am glad we are improving.  Its all because of your hard work.  Keep it up! Tell your wife hello.  Continue to do the perineum exercises at least 3 times a week.  Work on strengthening the [employer].  See if your personal trainer can help with this.  This could help with stability while seated on your bike. Would try to get a series and do 2-3 times a week.  Try these little changes and lets see you again in 3 months.

## 2013-02-26 ENCOUNTER — Ambulatory Visit (INDEPENDENT_AMBULATORY_CARE_PROVIDER_SITE_OTHER): Payer: Managed Care, Other (non HMO) | Admitting: Neurology

## 2013-02-26 ENCOUNTER — Encounter: Payer: Self-pay | Admitting: Neurology

## 2013-02-26 VITALS — BP 119/74 | HR 56 | Resp 15 | Ht 72.0 in | Wt 192.0 lb

## 2013-02-26 DIAGNOSIS — G4730 Sleep apnea, unspecified: Secondary | ICD-10-CM | POA: Insufficient documentation

## 2013-02-26 DIAGNOSIS — G4733 Obstructive sleep apnea (adult) (pediatric): Secondary | ICD-10-CM

## 2013-02-26 NOTE — Progress Notes (Signed)
[facility]  Provider:  Dr Jacob Lozano Referring Provider: Gwen Pounds, MD Primary Care Physician:  Jacob Pounds, MD  Chief Complaint  Patient presents with  . Follow-up    6 mos. , rm 11    HPI:  Jacob Lozano is a 57 y.o. male here as a referral from Dr. Timothy Lozano for  CPAP follow up. In January this year he had a gastrointestinal viral infect and was very sick.  This is a six-month revisit after the first 30 days but in jan 2014 the patient was placed on an auto-titrator CPAP for 30 days, after a HST confirmed OSA .   Jacob Lozano is an ambidextrous, Caucasian married male who was originally referred for loud snoring and sleep talking and his wife had witnessed apneas. He endorsed an Epworth score of only 2 points but if his fatigue severity score of 34 points the and was [place] his legs and reported that he woke up in the morning spontaneously and fairly refreshed he does endorse at 105 night on average he may have trouble with falling asleep or staying asleep. He does not use caffeine unless a.m. he drinks about 6 beers weekly. The sleep time is between 10:30 and 6:30 AM, and he rarely has nocturia. And also has no jaw or neck surgery or surgical alteration of the upper airway.  Patient was in January 2014 seen for a CPAP follow up;  At the time he was 97% compliant -reported that he was looking forward to using the machine ,  and felt more refreshed in the morning the auto- titration followed a home sleep test that revealed an AHI of 19 and December 2013.  The patient today and Doris the Epworth Sleepiness Scale at 4 points and the fatigue severity scale at 18 points. The download shows 100% compliance and residual AHI of 4.3 and average user time of 7 hours and 14 minutes.  This is fairly similar to the results of generally. The average device pressure is 8.4 cm the patient reports that he actually looks forward to wearing his CPAP as he is followed by a [facility] in  [place] for his DME needs-  He is using a nasal comfort mask,  not a nasal pillow.   The patient is slowly recovering from a pelvic floor muscle tear. He has resumed bicycling for about 45 minutes daily.        Review of Systems: Out of a complete 14 system review, the patient complains of only the following symptoms, and all other reviewed systems are negative. epworth 5 , FSS 18.    History   Social History  . Marital Status: Married    Spouse Name: N/A    Number of Children: N/A  . Years of Education: N/A   Occupational History  . Not on file.   Social History Main Topics  . Smoking status: Never Smoker   . Smokeless tobacco: Never Used  . Alcohol Use: Yes  . Drug Use: No  . Sexually Active: Not on file   Other Topics Concern  . Not on file   Social History Narrative  . No narrative on file    Family History  Problem Relation Age of Onset  . Heart disease Father   . Hypertension Father     Past Medical History  Diagnosis Date  . 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 Ulcer   . Hernia   . Obstructive apnea  CPAP     Past Surgical History  Procedure Laterality Date  . Appendectomy  2010  . Hernia repair  03/22/2011    Lap bilateral obturator & femoral hernia repairs    Current Outpatient Prescriptions  Medication Sig Dispense Refill  . atorvastatin (LIPITOR) 20 MG tablet Take 20 mg by mouth daily.      . Nutritional Supplements (JUICE PLUS FIBRE PO) Take 3 capsules by mouth daily.      . ondansetron (ZOFRAN) 8 MG tablet Take 1 tablet (8 mg total) by mouth every 8 (eight) hours as needed for nausea.  20 tablet  0  . pantoprazole (PROTONIX) 40 MG tablet Take 40 mg by mouth daily.       No current facility-administered medications for this visit.    Allergies as of 02/26/2013  . (No Known Allergies)    Vitals: BP 119/74  Pulse 56  Resp 15  Ht 6' (1.829 m)  Wt 192 lb (87.091 kg)  BMI 26.03 kg/m2 Last Weight:  Wt Readings from  Last 1 Encounters:  02/26/13 192 lb (87.091 kg)   Last Height:   Ht Readings from Last 1 Encounters:  02/26/13 6' (1.829 m)   Vision Screening:  Left eye with correction .  Right eye with correction  20/30 .  Physical exam:  General: The patient is awake, alert and appears not in acute distress. The patient is well groomed. Head: Normocephalic, atraumatic. Neck is supple. Mallampati 3 , neck circumference: 38 cm  Cardiovascular:  Regular rate and rhythm, without  murmurs or carotid bruit, and without distended neck veins. Respiratory: Lungs are clear to auscultation. Skin:  Without evidence of edema, or rash Trunk: BMI is normal.  Neurologic exam : The patient is awake and alert, oriented to place and time.  Memory subjective described as intact. There is a normal attention span & concentration ability. Speech is fluent without  dysarthria, dysphonia or aphasia. Mood and affect are appropriate.  Cranial nerves: Pupils are equal and briskly reactive to light. Funduscopic exam without  evidence of pallor or edema. Extraocular movements with endpoint nystagmus , bilateral horizontal  plane . Visual fields by finger perimetry are intact. Hearing to finger rub intact.  Facial sensation intact to fine touch. Facial motor strength is symmetric and tongue and uvula move midline.  Motor exam:   Normal tone and normal muscle bulk and symmetric normal strength in all extremities.  Sensory:  Fine touch, pinprick and vibration were tested in all extremities. Proprioception is tested in the upper extremities only. This was  normal.  Coordination: Rapid alternating movements in the fingers/hands is tested and normal. Finger-to-nose maneuver tested and normal without evidence of ataxia, dysmetria or tremor.  Gait and station: Patient walks without assistive device . Strength within normal limits. Stance is stable and normal. Tandem  unfragmented.  Deep tendon reflexes: in the  upper and lower  extremities are symmetric and intact.   Assessment:  After physical and neurologic examination, review of laboratory studies, imaging, neurophysiology testing and pre-existing records,  assessment will be reviewed on the problem list.  Plan:  Treatment plan - continued use of CPAP , unchanged in settings, from now on one yearly visit.  [employer] services is following him.

## 2013-03-04 ENCOUNTER — Encounter: Payer: Self-pay | Admitting: Neurology

## 2013-05-18 ENCOUNTER — Encounter: Payer: Self-pay | Admitting: Family Medicine

## 2013-05-18 ENCOUNTER — Ambulatory Visit (INDEPENDENT_AMBULATORY_CARE_PROVIDER_SITE_OTHER): Payer: Managed Care, Other (non HMO) | Admitting: Family Medicine

## 2013-05-18 ENCOUNTER — Ambulatory Visit: Payer: Managed Care, Other (non HMO) | Admitting: Family Medicine

## 2013-05-18 VITALS — BP 134/82 | HR 62 | Ht 73.0 in | Wt 188.0 lb

## 2013-05-18 DIAGNOSIS — M25569 Pain in unspecified knee: Secondary | ICD-10-CM

## 2013-05-18 DIAGNOSIS — M25562 Pain in left knee: Secondary | ICD-10-CM

## 2013-05-18 NOTE — Patient Instructions (Addendum)
Your history and exam are consistent with arthritis vs a degenerative medial meniscal tear. Both are treated similarly. Start home exercise program - 3 sets of 10 once a day of straight leg raises, straight leg raises with foot turned outward, and side raises. Take tylenol 500mg  1-2 tabs three times a day for pain. Aleve 1-2 tabs twice a day with food OR ibuprofen 600mg  three times a day with food for pain and inflammation. Consider glucosamine sulfate 750mg  twice a day - this is a supplement that may help. Capsaicin topically up to four times a day may also help with pain. Cortisone injections are an option with or without pulling the fluid off your knee. X-rays are unlikely to change this initial management though are also a consideration. It's important that you continue to stay active. Consider physical therapy to strengthen muscles around the joint that hurts to take pressure off of the joint itself. Shoe inserts with good arch support may be helpful. Ice 15 minutes at a time 3-4 times a day as needed to help with pain. Avoid deep squats, lunges, leg press with knees more than 90 degrees flexed.

## 2013-05-19 ENCOUNTER — Encounter: Payer: Self-pay | Admitting: Family Medicine

## 2013-05-19 DIAGNOSIS — M25562 Pain in left knee: Secondary | ICD-10-CM | POA: Insufficient documentation

## 2013-05-19 NOTE — Progress Notes (Signed)
Patient ID: Jacob Lozano, male   DOB: Oct 05, 1955, 57 y.o.   MRN: [id number]  PCP: Gwen Pounds, MD  Subjective:   HPI: Patient is a 57 y.o. male here for left knee swelling, pain.  Patient denies known injury. 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 Has rice crispies sound within knee with flexion. Stiffness primarily quad tendon area now. Gets a medial jolt with pivoting. No catching, locking, giving out.  Past Medical History  Diagnosis Date  . 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 Ulcer   . Hernia   . Obstructive apnea     CPAP   . Hyperlipidemia     Current Outpatient Prescriptions on File Prior to Visit  Medication Sig Dispense Refill  . atorvastatin (LIPITOR) 20 MG tablet Take 20 mg by mouth daily.      . pantoprazole (PROTONIX) 40 MG tablet Take 40 mg by mouth daily.      . Nutritional Supplements (JUICE PLUS FIBRE PO) Take 3 capsules by mouth daily.      . ondansetron (ZOFRAN) 8 MG tablet Take 1 tablet (8 mg total) by mouth every 8 (eight) hours as needed for nausea.  20 tablet  0   No current facility-administered medications on file prior to visit.    Past Surgical History  Procedure Laterality Date  . Appendectomy  2010  . Hernia repair  03/22/2011    Lap bilateral obturator & femoral hernia repairs    No Known Allergies  History   Social History  . Marital Status: Married    Spouse Name: N/A    Number of Children: N/A  . Years of Education: N/A   Occupational History  . Not on file.   Social History Main Topics  . Smoking status: Never Smoker   . Smokeless tobacco: Never Used  . Alcohol Use: Yes  . Drug Use: No  . Sexual Activity: Not on file   Other Topics Concern  . Not on file   Social History Narrative  . No narrative on file    Family History  Problem Relation Age of Onset  .  Heart disease Father   . Hypertension Father   . Diabetes Neg Hx   . Hyperlipidemia Neg Hx   . Sudden death Neg Hx     BP 134/82  Pulse 62  Ht 6\' 1"  (1.854 m)  Wt 188 lb (85.276 kg)  BMI 24.81 kg/m2  Review of Systems: See HPI above.    Objective:  Physical Exam:  Gen: NAD  Left knee: Mod effusion.  No bruising, other deformity.  No VMO atrophy. TTP medial joint line.  No post patellar facet, lateral joint line, other TTP. FROM. Negative ant/post drawers. Negative valgus/varus testing. Negative lachmanns. Equivocal mcmurrays.  Negative apleys, patellar apprehension, clarkes.  Mild pain with thessalys. NV intact distally.    Assessment & Plan:  1. 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  No prior h/o gout, pseudogout.  Discussed options.  Declined radiographs (discussed wouldn't change initial management).  Tylenol, nsaids (be careful with his PUD), glucosamine, capsaicin.  Start home exercise program which was reviewed today.  Consider intraarticular injection with or without aspiration if not improving.  Avoid deep squats, lunges, leg presses.  Has  f/u with Dr. Darrick Penna in 2 weeks for perineal muscle tear - can discuss these other options if not improving at that visit.

## 2013-05-19 NOTE — Assessment & Plan Note (Signed)
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  No prior h/o gout, pseudogout.  Discussed options.  Declined radiographs (discussed wouldn't change initial management).  Tylenol, nsaids (be careful with his PUD), glucosamine, capsaicin.  Start home exercise program which was reviewed today.  Consider intraarticular injection with or without aspiration if not improving.  Avoid deep squats, lunges, leg presses.  Has f/u with Dr. Darrick Penna in 2 weeks for perineal muscle tear - can discuss these other options if not improving at that visit.

## 2013-06-03 ENCOUNTER — Ambulatory Visit (INDEPENDENT_AMBULATORY_CARE_PROVIDER_SITE_OTHER): Payer: Managed Care, Other (non HMO) | Admitting: Sports Medicine

## 2013-06-03 ENCOUNTER — Encounter: Payer: Self-pay | Admitting: Sports Medicine

## 2013-06-03 VITALS — BP 126/84 | HR 54 | Ht 73.0 in | Wt 188.0 lb

## 2013-06-03 DIAGNOSIS — M25569 Pain in unspecified knee: Secondary | ICD-10-CM

## 2013-06-03 DIAGNOSIS — R102 Pelvic and perineal pain unspecified side: Secondary | ICD-10-CM

## 2013-06-03 DIAGNOSIS — M25519 Pain in unspecified shoulder: Secondary | ICD-10-CM

## 2013-06-03 DIAGNOSIS — M25562 Pain in left knee: Secondary | ICD-10-CM

## 2013-06-03 DIAGNOSIS — N509 Disorder of male genital organs, unspecified: Secondary | ICD-10-CM

## 2013-06-03 DIAGNOSIS — M25511 Pain in right shoulder: Secondary | ICD-10-CM

## 2013-06-03 NOTE — Assessment & Plan Note (Signed)
Pt exam and history consistent with possible degenerative medial meniscal tear.  Denies previous injury, or acute injury that brought on his Sx.  However, he is tender along the medial joint line with a + duck walk/Thessaly test along with MSK US showing medial meniscal bulging with hypoechoic edema surrounding the area, no gross peripheral tear noted.  Recommend continuing previous strengthening exercises of the knee w/o deep squats or acute twisting.  Continue with tylenol for pain control, ice to the area after activity, and if continues to be nuisance, can consider further imaging with X-rays but definite test would be MRI to evaluate for tear.  At this time, he denies any locking, catching, instability of the area.

## 2013-06-03 NOTE — Assessment & Plan Note (Signed)
Pt continues to have intermittent pain, worse with prolonged cycling, especially when not uphill when forced to remain on seat.  Has improved since last visit 4 months ago, and continues to perform his strengthening exercises 3-4 x per week, documented in spread sheet.  Will continue to follow along with this, and encouraged pt to continue to doing max of about 5 sets of the exercises, along with performing them about 3 x per week.

## 2013-06-03 NOTE — Progress Notes (Signed)
Jacob Lozano is a 57 y.o. male who presents today for f/u of perineal tear, left knee pain, and R shoulder pain.   Perineal Tear - Pt has had chronic perineal tear, that began around 1-2 yrs ago now that has been followed by Dr. Darrick Penna during that time.   He has continued to perform the rehabilitation exercises 3-4 x per week with some improvement as well.  Denies new injury at this time.  L knee pain - Pt states this has been ongoing for about 3 weeks now, no inciting injury or previous injury to the area.  Pain is located on the medial aspect of his knee, worse after any activity, and notices it with any type of quick cutting motion.  Denies any effusion, edema, locking, or catching.    R shoulder pain - pt has anterior shoulder pain, worse with any overhead activity.  He does not have any radiation or weakness in this arm.  Had previous bursitis about 30 yrs ago, had one steroid injection, and has not had any problems up until about 1 yr ago.  Denies any inciting injury or trauma or anything at that time.  Has intermittent pain, sharp, does not awaken him at night, worse with arm crossover with picking things up.  Rest does make the area seem better, and has not tried heat/ice to the area or anti-inflammatories.    Current Outpatient Prescriptions on File Prior to Visit  Medication Sig Dispense Refill  . atorvastatin (LIPITOR) 20 MG tablet Take 20 mg by mouth daily.      . Nutritional Supplements (JUICE PLUS FIBRE PO) Take 3 capsules by mouth daily.      . ondansetron (ZOFRAN) 8 MG tablet Take 1 tablet (8 mg total) by mouth every 8 (eight) hours as needed for nausea.  20 tablet  0  . pantoprazole (PROTONIX) 40 MG tablet Take 40 mg by mouth daily.       No current facility-administered medications on file prior to visit.    ROS: Per HPI.  All other systems reviewed and are negative.   Physical Exam Filed Vitals:   06/03/13 0836  BP: 126/84  Pulse: 54    Physical Examination: Gen:  NAD Knee: Normal to inspection with no erythema or effusion or obvious bony abnormalities. Palpation tenderness L medial joint line ROM normal in flexion and extension and lower leg rotation. Ligaments with solid consistent endpoints including ACL, PCL, LCL, MCL. + Thessaly, McMurrays, and Duck walk  Non painful patellar compression. Patellar and quadriceps tendons unremarkable. Hamstring and quadriceps strength is normal.  Shoulder: Inspection reveals no abnormalities, atrophy or asymmetry. Palpation around anterior glenoid lip  ROM is full in all planes. Rotator cuff strength normal throughout. + hawkin's, negative Neer's/empty can  Speeds and Yergason's tests normal. + Obrien's, negative clunk and good stability. Normal scapular function observed. No painful arc and no drop arm sign. No apprehension sign  MSK [place] -  R shoulder, long and short axis obtained - Supraspinatus, biceps tendon, infraspinatus, teres minor, subscapularis all visualized and appear grossly normal without hyperechoic or hypoechoic regions noted.  No hyperechoic regions noted at the glenoid consistent with possible spurring and his [facility] joint looks intact without edema.    L Knee - Patellar and quadriceps tendon intact without hypoechoic regions noted.  No suprapatellar edema noted.  + slight bulging at the posterior medial meniscus with small hypoechoic region surrounding, no gross tears noted.

## 2013-06-03 NOTE — Assessment & Plan Note (Signed)
Pt c/o R shoulder pain, intermittent, over the past yr.  Does not previous bursitis about 30 yrs ago, requiring one cortisone injection with improvement.  No recent or previous injury noted.  His shoulder exam does not show weakness, and possible mild impingement with Hawkin's maneuver.  However, his O'briens is + and he is tender at the anterior medial lip of the glenoid that could be degenerative labral tear.  For now, will tx conservative with strengthening exercises outlined for him (nothing past 30-40 degrees of IR), tylenol for pain, ice to the area, and to stop the shoulder presses.  F/U PRN and can consider imaging of the area if it continues.

## 2014-03-03 ENCOUNTER — Ambulatory Visit (INDEPENDENT_AMBULATORY_CARE_PROVIDER_SITE_OTHER): Payer: Managed Care, Other (non HMO) | Admitting: Neurology

## 2014-03-03 ENCOUNTER — Encounter (INDEPENDENT_AMBULATORY_CARE_PROVIDER_SITE_OTHER): Payer: Self-pay

## 2014-03-03 ENCOUNTER — Encounter: Payer: Self-pay | Admitting: Neurology

## 2014-03-03 VITALS — BP 123/81 | HR 50 | Resp 16 | Ht 74.5 in | Wt 190.0 lb

## 2014-03-03 DIAGNOSIS — Z9989 Dependence on other enabling machines and devices: Principal | ICD-10-CM

## 2014-03-03 DIAGNOSIS — G4733 Obstructive sleep apnea (adult) (pediatric): Secondary | ICD-10-CM

## 2014-03-03 NOTE — Progress Notes (Signed)
[facility]  Provider:  Dr Wilburn Keir Referring Provider: Precious Reel, MD Primary Care Physician:  Precious Reel, MD  Chief Complaint  Patient presents with  . Follow-up    Room 11  . Sleep Apnea    HPI:  Jacob Lozano is a 58 y.o. male here as a referral from Dr. Virgina Lozano for CPAP follow up.  In January this year he had a gastrointestinal viral infect and was very sick.  This is a six-month revisit after the first 30 days but in January 2014 the patient was placed on an auto-titrator CPAP for 30 days, after a HST confirmed OSA .  Jacob Lozano is an ambidextrous, caucasian married gentleman, who was originally referred for loud snoring and sleep talking and his wife had witnessed apneas.  He may have trouble  falling asleep or staying asleep. He does not use caffeine unless a.m. he drinks about 6 beers weekly.  His  sleep time is between 10:30 and 6:30 AM, and he rarely has nocturia. And also has no jaw or neck surgery or surgical alteration of the upper airway. Patient was in January 2014 seen for a CPAP follow up-  At the time he was 97% compliant -reported that he was looking forward to using the machine,  and felt more refreshed in the morning the auto- titration followed a home sleep test, that revealed an AHI of 19 in  December 2013.  The patient endorsed the Epworth Sleepiness Scale at 4 points and the fatigue severity scale at 18 points in his last visit 6 month ago.  The download shows 100% compliance and residual AHI of 4.3 and average user time of 7 hours and 14 minutes. This is fairly similar to the results of generally.  The average device pressure is 8.4 cm the patient reports that he actually looks forward to wearing his CPAP as he is followed by a [facility] in [place] for his DME needs-  He is using a nasal comfort mask, not a nasal pillow.  [employer] downloads available today, 03-03-14, AHI residual at 3.8 and user time 6 hours and 34 minutes, 100%  compliance for 30 days.   Patient has traits of OCD , ADHD - and focusses much better since on CPAP. He has never used SSRIs.           Review of Systems: Out of a complete 14 system review, the patient complains of only the following symptoms, and all other reviewed systems are negative. Epworth 4 , fatigue severity score ( FSS) 21.     History   Social History  . Marital Status: Married    Spouse Name: [name]    Number of Children: 0  . Years of Education: BSBA   Occupational History  .     Social History Main Topics  . Smoking status: Never Smoker   . Smokeless tobacco: Never Used  . Alcohol Use: Yes  . Drug Use: No  . Sexual Activity: Not on file   Other Topics Concern  . 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   Patient is working full-time.   Patient has a BSBA degree.   Patient is ambi-dextrous.   Patient drinks two cups of tea and 1/2 cup of soda and nothing after 1 pm.    Family History  Problem Relation Age of Onset  . Heart disease Father   . Hypertension Father   .  Diabetes Neg Hx   . Hyperlipidemia Neg Hx   . Sudden death Neg Hx     Past Medical History  Diagnosis Date  . 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 Ulcer   . Hernia   . Obstructive apnea     CPAP   . Hyperlipidemia     Past Surgical History  Procedure Laterality Date  . Appendectomy  2010  . Hernia repair  03/22/2011    Lap bilateral obturator & femoral hernia repairs    Current Outpatient Prescriptions  Medication Sig Dispense Refill  . atorvastatin (LIPITOR) 20 MG tablet Take 20 mg by mouth. Taking every 3 days      . Nutritional Supplements (JUICE PLUS FIBRE PO) Take 3 capsules by mouth daily.      . pantoprazole (PROTONIX) 40 MG tablet Take 40 mg by mouth daily.       No current facility-administered medications for this visit.    Allergies as of 03/03/2014  . (No Known Allergies)     Vitals: BP 123/81  Pulse 50  Resp 16  Ht 6' 2.5" (1.892 m)  Wt 190 lb (86.183 kg)  BMI 24.08 kg/m2 Last Weight:  Wt Readings from Last 1 Encounters:  03/03/14 190 lb (86.183 kg)   Last Height:   Ht Readings from Last 1 Encounters:  03/03/14 6' 2.5" (1.892 m)   Vision Screening:  Left eye with correction .  Right eye with correction  20/30 .  Uses the same glasses for 7 years.   Physical exam:  General: The patient is awake, alert and appears not in acute distress. The patient is well groomed. Head: Normocephalic, atraumatic. Neck is supple. Mallampati 3 , neck circumference: 38 cm  Cardiovascular:  Regular rate and rhythm, without  murmurs or carotid bruit, and without distended neck veins. Respiratory: Lungs are clear to auscultation. Skin:  Without evidence of edema, or rash Trunk: BMI is normal.  Neurologic exam : The patient is awake and alert, oriented to place and time.   Memory subjective described as intact. There is a normal attention span & concentration ability.  Speech is fluent without  dysarthria, dysphonia or aphasia. Mood and affect are appropriate.  Cranial nerves: Pupils are equal and briskly reactive to light. Funduscopic exam without  evidence of pallor or edema.  Extraocular movements with endpoint nystagmus , bilateral horizontal  plane . Visual fields by finger perimetry are intact. Hearing to finger rub intact.  Facial sensation intact to fine touch. Facial motor strength is symmetric and tongue and uvula move midline.  Motor exam:   Normal tone and normal muscle bulk and symmetric normal strength in all extremities.  Sensory:  Fine touch, pinprick and vibration were tested in all extremities. Proprioception is tested in the upper extremities only. This was  normal.  Coordination: Rapid alternating movements in the fingers/hands is tested and normal. Finger-to-nose maneuver tested and normal without evidence of ataxia, dysmetria or  tremor.  Gait and station: Patient walks without assistive device . Strength within normal limits. Stance is stable and normal. Tandem  unfragmented.  Deep tendon reflexes: in the  upper and lower extremities are symmetric and intact.   Assessment:  After physical and neurologic examination, review of laboratory studies, imaging, neurophysiology testing and pre-existing records,  OSA on CPAP - 100% compliance, overall benefit for the patient is high, he likes CPAP and sleeps better , deeper.   Plan:  Treatment plan - continued use  of CPAP , unchanged in settings, from now on one yearly visit.  [employer] services is following him.

## 2015-03-09 ENCOUNTER — Ambulatory Visit (INDEPENDENT_AMBULATORY_CARE_PROVIDER_SITE_OTHER): Payer: Managed Care, Other (non HMO) | Admitting: Neurology

## 2015-03-09 ENCOUNTER — Encounter: Payer: Self-pay | Admitting: Neurology

## 2015-03-09 VITALS — BP 120/80 | HR 70 | Resp 20 | Ht 74.0 in | Wt 195.0 lb

## 2015-03-09 DIAGNOSIS — G4731 Primary central sleep apnea: Secondary | ICD-10-CM | POA: Diagnosis not present

## 2015-03-09 NOTE — Progress Notes (Signed)
[facility]  Provider:  Dr Lille Karim Referring Provider: Shon Baton, MD Primary Care Physician:  Precious Reel, MD  Chief Complaint  Patient presents with  . Follow-up    cpap, rm 10, alone    HPI:  Jacob Lozano is a 59 y.o. male here as a referral from Dr. Virgina Jock for CPAP follow up.  In January this year he had a gastrointestinal viral infect and was very sick. This is a six-month revisit after the first 30 days but in January 2014 the patient was placed on an auto-titrator CPAP for 30 days, after a HST confirmed OSA .  Jacob Lozano is an ambidextrous, caucasian married gentleman, who was originally referred for loud snoring and sleep talking and his wife had witnessed apneas.  He may have trouble  falling asleep or staying asleep. He does not use caffeine unless a.m. he drinks about 6 beers weekly.  His  sleep time is between 10:30 and 6:30 AM, and he rarely has nocturia. And also has no jaw or neck surgery or surgical alteration of the upper airway. Patient was in January 2014 seen for a CPAP follow up-  At the time he was 97% compliant -reported that he was looking forward to using the machine,  and felt more refreshed in the morning the auto- titration followed a home sleep test, that revealed an AHI of 19 in  December 2013.  The patient endorsed the Epworth Sleepiness Scale at 4 points and the fatigue severity scale at 18 points in his last visit 6 month ago.  The download shows 100% compliance and residual AHI of 4.3 and average user time of 7 hours and 14 minutes. This is fairly similar to the results of generally.  The average device pressure is 8.4 cm the patient reports that he actually looks forward to wearing his CPAP as he is followed by a [facility] in [place] for his DME needs-  He is using a nasal comfort mask, not a nasal pillow.  [employer] downloads available today, 03-03-14, AHI residual at 3.8 and user time 6 hours and 34 minutes, 100% compliance for  30 days.  Patient has traits of OCD , ADHD - and focusses much better since on CPAP. He has never used SSRIs.   03-09-15   Epworth sleepiness score endorsed at 5 points and fatigue severity at 19 points. Only during February of this year that the patient have a period of " noncompliance "but he suffered from a sinus and upper respiratory tract infection. Otherwise he has been a compliant user and he has seen the difference that the CPAP use mates in his daytime alertness and productivity. He has had 4 nights of insomnia a year, he dreams , he is refreshed in AM.  He takes antihistamines for seasonal rhinitis.  The download reveals an average use of the CPAP machine for 6 hours and 13 minutes per night. The average hypotony index is around 3.0 there are very few true apneas measured. The residual AHI for the night from 7:15 09/26/2014 for example at a complete AHI of 2.5 and an average also pressure of 5.8. At the 90th percentile pressure the patient uses 8 cm water. Since this is an auto VPAP the patient can continue with the current settings that needs to be no adjustments made. He  Will get new supplies from his DME, namely [facility] based in [place], [place].          Review of Systems: Out  of a complete 14 system review, the patient complains of only the following symptoms, and all other reviewed systems are negative. Epworth 5 , fatigue severity score ( FSS) 19.   Pain with bicycling , had a muscle tear, not completely resolved.     History   Social History  . Marital Status: Married    Spouse Name: [name]  . Number of Children: 0  . Years of Education: BSBA   Occupational History  .     Social History Main Topics  . Smoking status: Never Smoker   . Smokeless tobacco: Never Used  . Alcohol Use: Yes  . Drug Use: No  . Sexual Activity: Not on file   Other Topics Concern  . 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   Patient is working full-time.   Patient has a BSBA degree.   Patient is ambi-dextrous.   Patient drinks two cups of tea and 1/2 cup of soda and nothing after 1 pm.    Family History  Problem Relation Age of Onset  . Heart disease Father   . Hypertension Father   . Diabetes Neg Hx   . Hyperlipidemia Neg Hx   . Sudden death Neg Hx     Past Medical History  Diagnosis Date  . 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 Ulcer   . Hernia   . Obstructive apnea     CPAP   . Hyperlipidemia     Past Surgical History  Procedure Laterality Date  . Appendectomy  2010  . Hernia repair  03/22/2011    Lap bilateral obturator & femoral hernia repairs    Current Outpatient Prescriptions  Medication Sig Dispense Refill  . atorvastatin (LIPITOR) 20 MG tablet Take 20 mg by mouth. Taking every 3 days    . Nutritional Supplements (JUICE PLUS FIBRE PO) Take 3 capsules by mouth daily.    . pantoprazole (PROTONIX) 40 MG tablet Take 40 mg by mouth daily.     No current facility-administered medications for this visit.    Allergies as of 03/09/2015  . (No Known Allergies)    Vitals: BP 120/80 mmHg  Pulse 70  Resp 20  Ht 6\' 2"  (1.88 m)  Wt 195 lb (88.451 kg)  BMI 25.03 kg/m2 Last Weight:  Wt Readings from Last 1 Encounters:  03/09/15 195 lb (88.451 kg)   Last Height:   Ht Readings from Last 1 Encounters:  03/09/15 6\' 2"  (1.88 m)   Vision Screening:  Left eye with correction .  Right eye with correction  20/30 .  Uses the same glasses for 7 years.   Physical exam:  General: The patient is awake, alert and appears not in acute distress. The patient is well groomed. Head: Normocephalic, atraumatic. Neck is supple. Mallampati 3 , neck circumference: 38 cm  Cardiovascular:  Regular rate and rhythm, without  murmurs or carotid bruit, and without distended neck veins. Respiratory: Lungs are clear to auscultation. Skin:  Without evidence of edema, or rash Trunk:  BMI is normal.  Neurologic exam : The patient is awake and alert, oriented to place and time.   Memory subjective described as intact. There is a normal attention span & concentration ability.  Speech is fluent without  dysarthria, dysphonia or aphasia. Mood and affect are appropriate.  Cranial nerves: Pupils are equal and briskly reactive to light. Funduscopic exam without  evidence of pallor or edema.  Extraocular movements with endpoint nystagmus , bilateral horizontal  plane . Visual fields by finger perimetry are intact. Hearing to finger rub intact.  Facial sensation intact to fine touch.  Facial motor strength is symmetric and tongue and uvula move midline.  Motor exam:   Normal tone and normal muscle bulk and symmetric normal strength in all extremities.  Sensory:  Fine touch, pinprick and vibration were tested in all extremities.  Proprioception is tested in the upper extremities only. This was  normal.  Coordination: Rapid alternating movements in the fingers/hands is tested and normal. Finger-to-nose maneuver tested and normal without evidence of ataxia, dysmetria or tremor.  Gait and station: Patient walks without assistive device . Strength within normal limits. Stance is stable and normal. Tandem  unfragmented.  Deep tendon reflexes: in the  upper and lower extremities are symmetric and intact.   Assessment:  After physical and neurologic examination, review of laboratory studies, imaging, neurophysiology testing and pre-existing records,  OSA on CPAP -  93% compliance, overall benefit for the patient is high, he likes CPAP and sleeps better , deeper.   Plan:  Treatment plan - continued use of CPAP , unchanged in settings, from now on one yearly visit.   [employer] services is following him.

## 2015-07-12 ENCOUNTER — Encounter: Payer: Self-pay | Admitting: Family Medicine

## 2015-07-12 ENCOUNTER — Encounter (INDEPENDENT_AMBULATORY_CARE_PROVIDER_SITE_OTHER): Payer: Self-pay

## 2015-07-12 ENCOUNTER — Ambulatory Visit (INDEPENDENT_AMBULATORY_CARE_PROVIDER_SITE_OTHER): Payer: Managed Care, Other (non HMO) | Admitting: Family Medicine

## 2015-07-12 VITALS — BP 127/77 | HR 51 | Ht 73.0 in | Wt 190.0 lb

## 2015-07-12 DIAGNOSIS — M7701 Medial epicondylitis, right elbow: Secondary | ICD-10-CM | POA: Diagnosis not present

## 2015-07-12 MED ORDER — NITROGLYCERIN 0.2 MG/HR TD PT24: MEDICATED_PATCH | TRANSDERMAL | Status: DC

## 2015-07-12 NOTE — Patient Instructions (Signed)
You have lateral epicondylitis Try to avoid painful activities as much as possible. Ice the area 3-4 times a day for 15 minutes at a time. Counterforce brace as directed can help unload area - wear this regularly if it provides you with relief. Hammer rotation exercise, wrist extension exercise with 1 pound weight - 3 sets of 10 once a day.   Stretching - hold for 20-30 seconds and repeat 3 times. Nitro patches 1/4th patch over affected area, change daily. Consider physical therapy, injectionif not improving. Follow up in 6 weeks.

## 2015-07-18 DIAGNOSIS — M7701 Medial epicondylitis, right elbow: Secondary | ICD-10-CM | POA: Insufficient documentation

## 2015-07-18 NOTE — Assessment & Plan Note (Signed)
2/2 medial epicondylitis.  Shown home exercises to do daily.  Icing, brace.  Start nitro patches - discussed risks of headache, skin irritation.  Consider PT, injection if not improving.  F/u in 6 weeks.

## 2015-07-18 NOTE — Progress Notes (Signed)
PCP: Precious Reel, MD  Subjective:   HPI: Patient is a 59 y.o. male here for right elbow pain.  Patient denies known injury. He states about 1 month ago he started to get sharp medial right elbow pain. Pain level 2/10. Worse with weight lifting. No swelling. Using a brace and icing. No prior issues. Left handed.  Past Medical History  Diagnosis Date  . Cancer ([place]) 2012    basal cell ca on nose  . Ulcer   . Hernia   . Obstructive apnea     CPAP   . Hyperlipidemia     Current Outpatient Prescriptions on File Prior to Visit  Medication Sig Dispense Refill  . atorvastatin (LIPITOR) 20 MG tablet Take 20 mg by mouth. Taking every 3 days    . Nutritional Supplements (JUICE PLUS FIBRE PO) Take 3 capsules by mouth daily.    . pantoprazole (PROTONIX) 40 MG tablet Take 40 mg by mouth daily.     No current facility-administered medications on file prior to visit.    Past Surgical History  Procedure Laterality Date  . Appendectomy  2010  . Hernia repair  03/22/2011    Lap bilateral obturator & femoral hernia repairs    No Known Allergies  Social History   Social History  . Marital Status: Married    Spouse Name: [name]  . Number of Children: 0  . Years of Education: BSBA   Occupational History  .     Social History Main Topics  . Smoking status: Never Smoker   . Smokeless tobacco: Never Used  . Alcohol Use: 0.0 oz/week    0 Standard drinks or equivalent per week  . Drug Use: No  . Sexual Activity: Not on file   Other Topics Concern  . 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   Patient is working full-time.   Patient has a BSBA degree.   Patient is ambi-dextrous.   Patient drinks two cups of tea and 1/2 cup of soda and nothing after 1 pm.    Family History  Problem Relation Age of Onset  . Heart disease Father   . Hypertension Father   . Diabetes Neg Hx   . Hyperlipidemia Neg Hx   .  Sudden death Neg Hx     BP 127/77 mmHg  Pulse 51  Ht 6\' 1"  (1.854 m)  Wt 190 lb (86.183 kg)  BMI 25.07 kg/m2  Review of Systems: See HPI above.    Objective:  Physical Exam:  Gen: NAD, comfortable in exam room.  Right elbow: No gross deformity, swelling, bruising. TTP medial epicondyle.  No other tenderness. FROM elbow without pain.  Pain with wrist flexion and pronation. Collateral ligaments intact. NVI distally.  Left elbow: FROM without pain.    Assessment & Plan:  1. Right elbow pain - 2/2 medial epicondylitis.  Shown home exercises to do daily.  Icing, brace.  Start nitro patches - discussed risks of headache, skin irritation.  Consider PT, injection if not improving.  F/u in 6 weeks.

## 2015-08-19 ENCOUNTER — Encounter: Payer: Self-pay | Admitting: Family Medicine

## 2015-08-19 ENCOUNTER — Ambulatory Visit (INDEPENDENT_AMBULATORY_CARE_PROVIDER_SITE_OTHER): Payer: Managed Care, Other (non HMO) | Admitting: Family Medicine

## 2015-08-19 VITALS — BP 120/78 | HR 52 | Ht 73.0 in | Wt 190.0 lb

## 2015-08-19 DIAGNOSIS — M7701 Medial epicondylitis, right elbow: Secondary | ICD-10-CM | POA: Diagnosis not present

## 2015-08-23 NOTE — Assessment & Plan Note (Signed)
2/2 medial epicondylitis.  Mild improvement.  Continue home exercises, sleeve.  Will increase nitro patches to 1/2 patch daily.  Icing as well.  F/u in 6 weeks.  Consider PT, injection if not improving.

## 2015-08-23 NOTE — Progress Notes (Signed)
PCP: Precious Reel, MD  Subjective:   HPI: Patient is a 60 y.o. male here for right elbow pain.  11/22: Patient denies known injury. He states about 1 month ago he started to get sharp medial right elbow pain. Pain level 2/10. Worse with weight lifting. No swelling. Using a brace and icing. No prior issues. Left handed.  12/30: Patient reports he has improved some. Is wearing sleeve, nitro patches and doing well. Had mild headaches for first 3-4 days with nitro but resolved. Able to ride mountain bike on Tuesday for 16 miles with only some pain. Pain 1/10 level medial elbow, dull. No skin changes, fever, other complaints. Doing home exercises.  Past Medical History  Diagnosis Date  . Cancer ([place]) 2012    basal cell ca on nose  . Ulcer   . Hernia   . Obstructive apnea     CPAP   . Hyperlipidemia     Current Outpatient Prescriptions on File Prior to Visit  Medication Sig Dispense Refill  . atorvastatin (LIPITOR) 20 MG tablet Take 20 mg by mouth. Taking every 3 days    . nitroGLYCERIN (NITRODUR - DOSED IN MG/24 HR) 0.2 mg/hr patch Apply 1/4th patch to affected elbow, change daily 30 patch 1  . Nutritional Supplements (JUICE PLUS FIBRE PO) Take 3 capsules by mouth daily.    . pantoprazole (PROTONIX) 40 MG tablet Take 40 mg by mouth daily.     No current facility-administered medications on file prior to visit.    Past Surgical History  Procedure Laterality Date  . Appendectomy  2010  . Hernia repair  03/22/2011    Lap bilateral obturator & femoral hernia repairs    No Known Allergies  Social History   Social History  . Marital Status: Married    Spouse Name: [name]  . Number of Children: 0  . Years of Education: BSBA   Occupational History  .     Social History Main Topics  . Smoking status: Never Smoker   . Smokeless tobacco: Never Used  . Alcohol Use: 0.0 oz/week    0 Standard drinks or equivalent per week  . Drug Use: No  . Sexual Activity: Not  on file   Other Topics Concern  . 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   Patient is working full-time.   Patient has a BSBA degree.   Patient is ambi-dextrous.   Patient drinks two cups of tea and 1/2 cup of soda and nothing after 1 pm.    Family History  Problem Relation Age of Onset  . Heart disease Father   . Hypertension Father   . Diabetes Neg Hx   . Hyperlipidemia Neg Hx   . Sudden death Neg Hx     BP 120/78 mmHg  Pulse 52  Ht 6\' 1"  (1.854 m)  Wt 190 lb (86.183 kg)  BMI 25.07 kg/m2  Review of Systems: See HPI above.    Objective:  Physical Exam:  Gen: NAD, comfortable in exam room.  Right elbow: No gross deformity, swelling, bruising. Mild TTP medial epicondyle.  No other tenderness. FROM elbow without pain.  No pain with wrist flexion and pronation. Collateral ligaments intact. NVI distally.  Left elbow: FROM without pain.    Assessment & Plan:  1. Right elbow pain - 2/2 medial epicondylitis.  Mild improvement.  Continue home exercises, sleeve.  Will increase nitro patches to 1/2  patch daily.  Icing as well.  F/u in 6 weeks.  Consider PT, injection if not improving.

## 2015-08-30 MED ORDER — NITROGLYCERIN 0.2 MG/HR TD PT24: MEDICATED_PATCH | TRANSDERMAL | Status: DC

## 2015-08-30 NOTE — Addendum Note (Signed)
Addended by: Dene Gentry on: 08/30/2015 02:52 PM   Modules accepted: Orders

## 2016-01-17 ENCOUNTER — Encounter: Payer: Self-pay | Admitting: Family Medicine

## 2016-01-17 ENCOUNTER — Ambulatory Visit (INDEPENDENT_AMBULATORY_CARE_PROVIDER_SITE_OTHER): Payer: Managed Care, Other (non HMO) | Admitting: Family Medicine

## 2016-01-17 VITALS — BP 144/88 | HR 57 | Ht 73.0 in | Wt 185.0 lb

## 2016-01-17 DIAGNOSIS — M7662 Achilles tendinitis, left leg: Secondary | ICD-10-CM | POA: Diagnosis not present

## 2016-01-17 NOTE — Patient Instructions (Signed)
You have mild Achilles Tendinopathy Ibuprofen 600mg  three times a day with food OR aleve 2 tabs twice a day with food for pain and inflammation ONLY if needed. In a couple weeks start Calf raises 3 sets of 10 on level ground once a day first. When these are easy, can do them one legged 3 sets of 10. Finally advance to doing them on a step. Ice bucket 10-15 minutes at end of day - can ice 3-4 times a day. Avoid uneven ground, hills as much as possible. Heel lifts in shoes or shoes with a natural heel lift. Consider physical therapy, orthotics, nitro patches if not improving as expected. Follow up in 6 weeks. No restrictions on activities though.

## 2016-01-18 DIAGNOSIS — M7662 Achilles tendinitis, left leg: Secondary | ICD-10-CM | POA: Insufficient documentation

## 2016-01-18 NOTE — Progress Notes (Signed)
PCP: Precious Reel, MD  Subjective:   HPI: Patient is a 60 y.o. male here for left ankle pain.  Patient reports he's had about 3 weeks of left posterior ankle pain. No known injury. Has started doing a walk/jog program over past month but has done 4 miles at most, maybe 25-30 miles total in the month. No swelling. Pain is 0/10 at rest, can be very mild maybe 1/10 and just a soreness. No prior issues here. No skin changes, numbness.  Past Medical History  Diagnosis Date  . Cancer ([place]) 2012    basal cell ca on nose  . Ulcer   . Hernia   . Obstructive apnea     CPAP   . Hyperlipidemia     Current Outpatient Prescriptions on File Prior to Visit  Medication Sig Dispense Refill  . atorvastatin (LIPITOR) 20 MG tablet Take 20 mg by mouth. Taking every 3 days    . nitroGLYCERIN (NITRODUR - DOSED IN MG/24 HR) 0.2 mg/hr patch Apply 1/2 patch to affected elbow, change daily 30 patch 1  . Nutritional Supplements (JUICE PLUS FIBRE PO) Take 3 capsules by mouth daily.    . pantoprazole (PROTONIX) 40 MG tablet Take 40 mg by mouth daily.     No current facility-administered medications on file prior to visit.    Past Surgical History  Procedure Laterality Date  . Appendectomy  2010  . Hernia repair  03/22/2011    Lap bilateral obturator & femoral hernia repairs    No Known Allergies  Social History   Social History  . Marital Status: Married    Spouse Name: [name]  . Number of Children: 0  . Years of Education: BSBA   Occupational History  .     Social History Main Topics  . Smoking status: Never Smoker   . Smokeless tobacco: Never Used  . Alcohol Use: 0.0 oz/week    0 Standard drinks or equivalent per week  . Drug Use: No  . Sexual Activity: Not on file   Other Topics Concern  . 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   Patient is working full-time.   Patient has a BSBA degree.   Patient is  ambi-dextrous.   Patient drinks two cups of tea and 1/2 cup of soda and nothing after 1 pm.    Family History  Problem Relation Age of Onset  . Heart disease Father   . Hypertension Father   . Diabetes Neg Hx   . Hyperlipidemia Neg Hx   . Sudden death Neg Hx     BP 144/88 mmHg  Pulse 57  Ht 6\' 1"  (1.854 m)  Wt 185 lb (83.915 kg)  BMI 24.41 kg/m2  Review of Systems: See HPI above.    Objective:  Physical Exam:  Gen: NAD, comfortable in exam room  Left foot/ankle: No gross deformity, swelling, ecchymoses FROM ankle without pain. TTP medially at achilles insertion.  No other tenderness. Negative ant drawer and talar tilt.   Negative syndesmotic compression. Negative calcaneal squeeze. Minimal pain with hop test, can complete this. Thompsons test negative. NV intact distally.    MSK u/s:  No abnormalities of left achilles, calcaneus.  Assessment & Plan:  1. Left achilles tendinopathy - mild, insertional.  Shown home exercises to do daily, stretches after a couple weeks.  Try to avoid hills, uneven ground.  NSAIDs as needed.  Heel lifts provided.  F/u in 6 weeks.  No restrictions on activities.

## 2016-01-18 NOTE — Assessment & Plan Note (Signed)
Left achilles tendinopathy - mild, insertional.  Shown home exercises to do daily, stretches after a couple weeks.  Try to avoid hills, uneven ground.  NSAIDs as needed.  Heel lifts provided.  F/u in 6 weeks.  No restrictions on activities.

## 2016-02-22 ENCOUNTER — Other Ambulatory Visit: Payer: Self-pay | Admitting: Family Medicine

## 2016-02-28 ENCOUNTER — Ambulatory Visit: Payer: Managed Care, Other (non HMO) | Admitting: Family Medicine

## 2016-03-04 ENCOUNTER — Encounter: Payer: Self-pay | Admitting: Neurology

## 2016-03-05 ENCOUNTER — Ambulatory Visit (INDEPENDENT_AMBULATORY_CARE_PROVIDER_SITE_OTHER): Payer: Managed Care, Other (non HMO) | Admitting: Neurology

## 2016-03-05 ENCOUNTER — Encounter: Payer: Self-pay | Admitting: Neurology

## 2016-03-05 VITALS — BP 118/70 | HR 56 | Resp 20 | Ht 73.0 in | Wt 190.0 lb

## 2016-03-05 DIAGNOSIS — G4733 Obstructive sleep apnea (adult) (pediatric): Secondary | ICD-10-CM | POA: Diagnosis not present

## 2016-03-05 DIAGNOSIS — Z9989 Dependence on other enabling machines and devices: Principal | ICD-10-CM

## 2016-03-05 NOTE — Progress Notes (Signed)
[facility]  Provider:  Dr Sterling Ucci Referring Provider: Shon Baton, MD Primary Care Physician:  Precious Reel, MD  Chief Complaint  Patient presents with  . Follow-up    cpap going well    HPI:  Jacob Lozano is a 60 y.o. male here as a referral from Dr. Virgina Jock for CPAP follow up.  In January this year he had a gastrointestinal viral infect and was very sick. This is a six-month revisit after the first 30 days but in January 2014 the patient was placed on an auto-titrator CPAP for 30 days, after a HST confirmed OSA .  Jacob Lozano is an ambidextrous, caucasian married gentleman, who was originally referred for loud snoring and sleep talking and his wife had witnessed apneas.  He may have trouble  falling asleep or staying asleep. He does not use caffeine unless a.m. he drinks about 6 beers weekly.  His  sleep time is between 10:30 and 6:30 AM, and he rarely has nocturia. And also has no jaw or neck surgery or surgical alteration of the upper airway. Patient was in January 2014 seen for a CPAP follow up- at the time he was 97% compliant -reported that he was looking forward to using the machine,  and felt more refreshed in the morning the auto- titration followed a home sleep test, that revealed an AHI of 19 in  December 2013. The patient endorsed the Epworth Sleepiness Scale at 4 points and the fatigue severity scale at 18 points in his last visit 6 month ago.  The download shows 100% compliance and residual AHI of 4.3 and average user time of 7 hours and 14 minutes. This is fairly similar to the results of generally.  The average device pressure is 8.4 cm the patient reports that he actually looks forward to wearing his CPAP as he is followed by a [facility] in [place] for his DME needs-  He is using a nasal comfort mask, not a nasal pillow.  [employer] downloads available today, 03-03-14, AHI residual at 3.8 and user time 6 hours and 34 minutes, 100% compliance for 30  days.  Patient has traits of OCD , ADHD - and focusses much better since on CPAP. He has never used SSRIs.   03-09-15 Epworth sleepiness score endorsed at 5 points and fatigue severity at 19 points. Only during February of this year that the patient have a period of " noncompliance "but he suffered from a sinus and upper respiratory tract infection. Otherwise he has been a compliant user and he has seen the difference that the CPAP use mates in his daytime alertness and productivity. He has had 4 nights of insomnia a year, he dreams , he is refreshed in AM.  He takes antihistamines for seasonal rhinitis.  The download reveals an average use of the CPAP machine for 6 hours and 13 minutes per night. The average hypotony index is around 3.0 there are very few true apneas measured. The residual AHI for the night from 7:15 09/26/2014 for example at a complete AHI of 2.5 and an average also pressure of 5.8. At the 90th percentile pressure the patient uses 8 cm water. Since this is an auto VPAP the patient can continue with the current settings that needs to be no adjustments made. He  Will get new supplies from his DME, namely [facility] based in [place], [place].  Interval history from 03/05/2016. We are here for her yearly compliance review with Jacob Lozano his compliance  has been excellent at 97% with an average user time of 7 hours and 4 minutes and a residual AHI of 3.7. He is using the same humidification settings he has a brief ramp time of 5 minutes and uses a minimum pressure of 4 and a maximum pressure of 12 cm water. Based on these results is no adjustment to be made. He enjoys his new home of the last year, still renovating.    Review of Systems: Out of a complete 14 system review, the patient complains of only the following symptoms, and all other reviewed systems are negative. Epworth 3 , fatigue severity score ( FSS) 19.   Pain with bicycling , had a muscle tear since 2014 , not  completely resolved.    Social History   Social History  . Marital Status: Married    Spouse Name: [name]  . Number of Children: 0  . Years of Education: BSBA   Occupational History  .     Social History Main Topics  . Smoking status: Never Smoker   . Smokeless tobacco: Never Used  . Alcohol Use: 0.0 oz/week    0 Standard drinks or equivalent per week  . Drug Use: No  . Sexual Activity: Not on file   Other Topics Concern  . 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   Patient is working full-time.   Patient has a BSBA degree.   Patient is ambi-dextrous.   Patient drinks two cups of tea and 1/2 cup of soda and nothing after 1 pm.    Family History  Problem Relation Age of Onset  . Heart disease Father   . Hypertension Father   . Diabetes Neg Hx   . Hyperlipidemia Neg Hx   . Sudden death Neg Hx     Past Medical History  Diagnosis Date  . Cancer ([place]) 2012    basal cell ca on nose  . Ulcer   . Hernia   . Obstructive apnea     CPAP   . Hyperlipidemia     Past Surgical History  Procedure Laterality Date  . Appendectomy  2010  . Hernia repair  03/22/2011    Lap bilateral obturator & femoral hernia repairs    Current Outpatient Prescriptions  Medication Sig Dispense Refill  . atorvastatin (LIPITOR) 20 MG tablet Take 20 mg by mouth. Taking every 3 days    . lansoprazole (PREVACID) 15 MG capsule Take 15 mg by mouth daily at 12 noon.    . nitroGLYCERIN (NITRODUR - DOSED IN MG/24 HR) 0.2 mg/hr patch Apply 1/2 patch to affected elbow, change daily 30 patch 1  . Nutritional Supplements (JUICE PLUS FIBRE PO) Take 3 capsules by mouth daily.     No current facility-administered medications for this visit.    Allergies as of 03/05/2016  . (No Known Allergies)    Vitals: BP 118/70 mmHg  Pulse 56  Resp 20  Ht 6\' 1"  (1.854 m)  Wt 190 lb (86.183 kg)  BMI 25.07 kg/m2 Last Weight:  Wt Readings  from Last 1 Encounters:  03/05/16 190 lb (86.183 kg)   Last Height:   Ht Readings from Last 1 Encounters:  03/05/16 6\' 1"  (1.854 m)   Vision Screening:  Left eye with correction .  Right eye with correction  20/30 .  Uses the same glasses for 7 years.   Physical exam:  General: The patient is  awake, alert and appears not in acute distress. The patient is well groomed. Head: Normocephalic, atraumatic. Neck is supple. Mallampati 3 , neck circumference: 38 cm  Cardiovascular:  Regular rate and rhythm, without  murmurs or carotid bruit, and without distended neck veins. Respiratory: Lungs are clear to auscultation. Skin:  Without evidence of edema, or rash Trunk: BMI is normal.  Neurologic exam : The patient is awake and alert, oriented to place and time.   Memory subjective described as intact. There is a normal attention span & concentration ability.  Speech is fluent without  dysarthria, dysphonia or aphasia. Mood and affect are appropriate.  Cranial nerves: Pupils are equal and briskly reactive to light. Funduscopic exam without  evidence of pallor or edema.  Extraocular movements with endpoint nystagmus , bilateral horizontal  plane . Visual fields by finger perimetry are intact. Hearing to finger rub intact.  Facial sensation intact to fine touch.  Facial motor strength is symmetric and tongue and uvula move midline.  Motor exam:   Normal tone and normal muscle bulk and symmetric normal strength in all extremities. Sensory:  Fine touch, pinprick and vibration were tested in all extremities.  Proprioception is tested in the upper extremities only. This was  normal. Assessment:  After physical and neurologic examination, review of laboratory studies, imaging, neurophysiology testing and pre-existing records,  OSA on CPAP -  97% compliance, overall benefit for the patient is high, he likes CPAP and sleeps better , deeper.   Plan:  Treatment plan - continued use of CPAP ,  unchanged in settings, from now on one yearly visit.   [employer] [facility] is following him.      Cc Dr. Virgina Jock

## 2016-03-27 ENCOUNTER — Encounter: Payer: Self-pay | Admitting: *Deleted

## 2016-08-23 ENCOUNTER — Encounter: Payer: Self-pay | Admitting: Sports Medicine

## 2016-08-23 ENCOUNTER — Ambulatory Visit (INDEPENDENT_AMBULATORY_CARE_PROVIDER_SITE_OTHER): Payer: Managed Care, Other (non HMO) | Admitting: Sports Medicine

## 2016-08-23 ENCOUNTER — Ambulatory Visit: Payer: Self-pay

## 2016-08-23 VITALS — BP 110/78 | Ht 73.0 in | Wt 190.0 lb

## 2016-08-23 DIAGNOSIS — M25511 Pain in right shoulder: Secondary | ICD-10-CM | POA: Diagnosis not present

## 2016-08-23 DIAGNOSIS — M25561 Pain in right knee: Secondary | ICD-10-CM | POA: Diagnosis not present

## 2016-08-23 DIAGNOSIS — M7662 Achilles tendinitis, left leg: Secondary | ICD-10-CM

## 2016-08-23 DIAGNOSIS — M7701 Medial epicondylitis, right elbow: Secondary | ICD-10-CM

## 2016-08-23 DIAGNOSIS — M7050 Other bursitis of knee, unspecified knee: Secondary | ICD-10-CM | POA: Diagnosis not present

## 2016-08-23 NOTE — Patient Instructions (Signed)
Please try topical capsaicin cream or aspercream to help with your bursitis and AC joint.  Apply twice a day.

## 2016-08-23 NOTE — Progress Notes (Signed)
Jacob Lozano - 61 y.o. male MRN [id number]  Date of birth: 1956/02/17  SUBJECTIVE:  Including CC & ROS.  CC: multiple  Has multiple complaints today which include follow-up of his left Achilles tendon, right anterior knee pain, right anterior shoulder pain, right medial elbow pain. Regards to his left Achilles he reports that he is doing better from previous. He was given heel lifts and eccentric exercises to do and he reports that he has not been doing the exercises but he has also not been running due to knee pain. He would like to review these exercises. Regards to his right knee reports pain on the anterior inferior aspect of the knee more at the proximal tibia. It occurred one day as he was running and has become progressively worse. He is not flexible in his hamstrings. Denies any swelling to the knee. Denies any specific injury to the knee. Denies any catching or locking. Denies any numbness or tingling distally. Regards to his right elbow he is right-hand dominant. He complains of medial elbow pain that has been ongoing for the past year. He reports pain when he was doing tricep exercises with his trainer. He did not feel any pop in did not have bruising to the area. He reports that it has improved. However he would like some advice on how to make this better. Regards to his right shoulder he is having anterior shoulder pain. He states that he has pain with overhead activities. Denies any specific injury to the shoulder. Does not report weakness but just pain. No numbness or tingling down the arm.   ROS: No unexpected weight loss, fever, chills, swelling, instability, muscle pain, numbness/tingling, redness, otherwise see HPI   PMHx - Updated and reviewed.  Contributory factors include: Negative PSHx - Updated and reviewed.  Contributory factors include:  Negative FHx - Updated and reviewed.  Contributory factors include:  Negative Social Hx - Updated and reviewed. Contributory factors  include: Negative Medications - reviewed   DATA REVIEWED: none  PHYSICAL EXAM:  VS: BP:110/78  HR: bpm  TEMP: ( )  RESP:   HT:6\' 1"  (185.4 cm)   WT:190 lb (86.2 kg)  BMI:25.1 PHYSICAL EXAM: Gen: NAD, alert, cooperative with exam, well-appearing HEENT: clear conjunctiva,  CV:  no edema, capillary refill brisk, normal rate Resp: non-labored Skin: no rashes, normal turgor  Neuro: no gross deficits.  Psych:  alert and oriented  Shoulder: Inspection reveals no abnormalities, atrophy or asymmetry. Palpation is with tenderness over AC joint. ROM is full in all planes, but has pain at ends of ROM. Rotator cuff strength normal throughout. Positive signs of impingement with positive Neer and Hawkin's tests, empty can sign. Speeds and Yergason's tests normal. No labral pathology noted with negative Obrien's, negative clunk and good stability. Normal scapular function observed. No painful arc and no drop arm sign. No apprehension sign  Knee: Normal to inspection with no erythema or effusion or obvious bony abnormalities. Palpation with tenderness to palpation over pes anserine bursa, but no joint line tenderness, patellar tenderness, or condyle tenderness. ROM full in flexion and extension and lower leg rotation. Ligaments with solid consistent endpoints including ACL, PCL, LCL, MCL. Negative Mcmurray's, Apley's, and Thessalonian tests. Non painful patellar compression. Patellar glide without crepitus. Patellar and quadriceps tendons unremarkable. Hamstring and quadriceps strength is normal.  Hamstring flexibility is abnormal with him only being flexible to 70 bilaterally  Elbow: Unremarkable to inspection. Range of motion full pronation, supination, flexion, extension. Strength is full  to all of the above directions Stable to varus, valgus stress. Negative moving valgus stress test. tenderness to palpation at the medial epicondyle. Ulnar nerve does not sublux. Negative  cubital tunnel Tinel's.  Ankle & Foot: No visible swelling, ecchymosis, erythema, ulcers, calluses, blister Arch: Normal w/o pes cavus or planus  Achilles tendon with mild tenderness to palpation No swelling of retrocalcaneal bursa No sign of peroneal tendon subluxations or tenderness to palpation Full in plantarflexion, dorsiflexion, inversion, and eversion of the foot; flexion and extension of the toes Strength: 5/5 in all directions. Sensation: intact Vascular: intact w/ dorsalis pedis & posterior tibialis pulses 2+   Ultrasound: Limited ultrasound of his right shoulder performed. AC joint is with small amount of fluid present in the joint, but no mushroom sign. He has no joint space narrowing. No spurring noted. Biceps tendon is with normal fibrillar pattern without any swelling or tears. Subscapularis is without impingement and has normal fibular if pattern. Supraspinatus is without any tearing and has a normal fibular pattern. Infraspinatus and teres minor without any tearing or fibrillary changes. Findings consistent with acromioclavicular joint irritation but not arthritis. Limited ultrasound of the right elbow performed which showed no tearing of the flexor tendons at the medial condyle. No joint effusion is seen. Limited ultrasound of the right knee is performed at the toes answering bursa. This did show some edematous changes at the bursa. Sartorius and gracilis attachments did have them fluid between the tendons. Findings consistent with pes anserine bursitis.  ASSESSMENT & PLAN:   Medial epicondylitis of right elbow Gave exercises and recommended modification of activity.  Topical OTC anti-inflammatories for pain.  Left Achilles tendinitis Continue exercises and use topical cream for pain.  Heel lifts already given.    Pes anserine bursitis Gave exercises for sartorius, gracilis and hamstrings.  Stretching recommended as well.  Topical cream for pain.    Arthralgia of right  acromioclavicular joint Gave scapular stabilizing exercises and topical cream for pain.  Patient was counseled reviewing diagnosis and treatment in detail, totaling in 40 minutes, over half of which was spent in face to face counseling.

## 2016-08-24 NOTE — Assessment & Plan Note (Signed)
Gave scapular stabilizing exercises and topical cream for pain.

## 2016-08-24 NOTE — Assessment & Plan Note (Signed)
Gave exercises and recommended modification of activity.  Topical OTC anti-inflammatories for pain.

## 2016-08-24 NOTE — Assessment & Plan Note (Signed)
Continue exercises and use topical cream for pain.  Heel lifts already given.

## 2016-08-24 NOTE — Assessment & Plan Note (Signed)
Gave exercises for sartorius, gracilis and hamstrings.  Stretching recommended as well.  Topical cream for pain.

## 2017-03-07 ENCOUNTER — Encounter: Payer: Self-pay | Admitting: Neurology

## 2017-03-07 ENCOUNTER — Encounter (INDEPENDENT_AMBULATORY_CARE_PROVIDER_SITE_OTHER): Payer: Self-pay

## 2017-03-07 ENCOUNTER — Ambulatory Visit (INDEPENDENT_AMBULATORY_CARE_PROVIDER_SITE_OTHER): Payer: Managed Care, Other (non HMO) | Admitting: Neurology

## 2017-03-07 VITALS — BP 118/80 | HR 54 | Wt 195.5 lb

## 2017-03-07 DIAGNOSIS — Z9989 Dependence on other enabling machines and devices: Secondary | ICD-10-CM

## 2017-03-07 DIAGNOSIS — G4733 Obstructive sleep apnea (adult) (pediatric): Secondary | ICD-10-CM | POA: Diagnosis not present

## 2017-03-07 NOTE — Progress Notes (Signed)
[facility]  Provider:  Dr Galia Rahm Referring Provider: Shon Baton, MD Primary Care Physician:  Shon Baton, MD  Chief Complaint  Patient presents with  . Follow-up    HPI:  Jacob Lozano is a 61 y.o. male here as a referral from Dr. Virgina Jock for CPAP follow up.  In January this year he had a gastrointestinal viral infect and was very sick. This is a six-month revisit after the first 30 days but in January 2014 the patient was placed on an auto-titrator CPAP for 30 days, after a HST confirmed OSA . Mr. Grizzle is an ambidextrous, caucasian married gentleman, who was originally referred for loud snoring and sleep talking and his wife had witnessed apneas.  He may have trouble  falling asleep or staying asleep. He does not use caffeine unless a.m. he drinks about 6 beers weekly.  His  sleep time is between 10:30 and 6:30 AM, and he rarely has nocturia. And also has no jaw or neck surgery or surgical alteration of the upper airway. Patient was in January 2014 seen for a CPAP follow up- at the time he was 97% compliant -reported that he was looking forward to using the machine,  and felt more refreshed in the morning the auto- titration followed a home sleep test, that revealed an AHI of 19 in  December 2013. The patient endorsed the Epworth Sleepiness Scale at 4 points and the fatigue severity scale at 18 points in his last visit 6 month ago.  The download shows 100% compliance and residual AHI of 4.3 and average user time of 7 hours and 14 minutes. This is fairly similar to the results of generally. The average device pressure is 8.4 cm the patient reports that he actually looks forward to wearing his CPAP as he is followed by a [facility] in [place] for his DME needs-  He is using a nasal comfort mask, not a nasal pillow.  [employer] downloads available today, 03-03-14, AHI residual at 3.8 and user time 6 hours and 34 minutes, 100% compliance for 30 days.  Patient has traits  of OCD , ADHD - and focusses much better since on CPAP. He has never used SSRIs.   03-09-15 Epworth sleepiness score endorsed at 5 points and fatigue severity at 19 points. Only during February of this year that the patient have a period of " noncompliance "but he suffered from a sinus and upper respiratory tract infection. Otherwise he has been a compliant user and he has seen the difference that the CPAP use mates in his daytime alertness and productivity. He has had 4 nights of insomnia a year, he dreams , he is refreshed in AM.  He takes antihistamines for seasonal rhinitis.  The download reveals an average use of the CPAP machine for 6 hours and 13 minutes per night. The average hypotony index is around 3.0 there are very few true apneas measured. The residual AHI for the night from 7:15 09/26/2014 for example at a complete AHI of 2.5 and an average also pressure of 5.8. At the 90th percentile pressure the patient uses 8 cm water. Since this is an auto VPAP the patient can continue with the current settings that needs to be no adjustments made. He  Will get new supplies from his DME, namely [facility] based in [place], [place].  Interval history from 03/05/2016. We are here for her yearly compliance review with Mr. Kervin his compliance has been excellent at 97% with an average  user time of 7 hours and 4 minutes and a residual AHI of 3.7. He is using the same humidification settings he has a brief ramp time of 5 minutes and uses a minimum pressure of 4 and a maximum pressure of 12 cm water. Based on these results is no adjustment to be made. He enjoys his new home of the last year, still renovating.   Interval history from 03/07/2017, Mr. Galentine is here today for his routine compliance visit. He is a highly compliant CPAP user with 97% compliance over the last 30 days, average user time 7 hours and 4 minutes at night, his average AHI is 3.7 he does not have high air leaks, he is using  an auto CPAP with a 90 percentile pressure of 8.6 cm water. There is no evidence of central apneas emerging under treatment. He does not have major air leaks.     Review of Systems: Out of a complete 14 system review, the patient complains of only the following symptoms, and all other reviewed systems are negative. Epworth 3 , fatigue severity score ( FSS) 16.   Pain with bicycling , had a muscle tear since 2014 , not completely resolved.    Social History   Social History  . Marital status: Married    Spouse name: [name]  . Number of children: 0  . Years of education: BSBA   Occupational History  .  [employer] History Main Topics  . Smoking status: Never Smoker  . Smokeless tobacco: Never Used  . Alcohol use 0.0 oz/week  . Drug use: No  . Sexual activity: Not on file   Other Topics Concern  . 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   Patient is working full-time.   Patient has a BSBA degree.   Patient is ambi-dextrous.   Patient drinks two cups of tea and 1/2 cup of soda and nothing after 1 pm.    Family History  Problem Relation Age of Onset  . Heart disease Father   . Hypertension Father   . Diabetes Neg Hx   . Hyperlipidemia Neg Hx   . Sudden death Neg Hx     Past Medical History:  Diagnosis Date  . Cancer ([place]) 2012   basal cell ca on nose  . Hernia   . Hyperlipidemia   . Obstructive apnea    CPAP   . Ulcer     Past Surgical History:  Procedure Laterality Date  . APPENDECTOMY  2010  . HERNIA REPAIR  03/22/2011   Lap bilateral obturator & femoral hernia repairs    Current Outpatient Prescriptions  Medication Sig Dispense Refill  . atorvastatin (LIPITOR) 20 MG tablet Take 20 mg by mouth. Taking every 3 days    . lansoprazole (PREVACID) 15 MG capsule Take 15 mg by mouth daily at 12 noon.    . Nutritional Supplements (JUICE PLUS FIBRE PO) Take 3 capsules by  mouth daily.     No current facility-administered medications for this visit.     Allergies as of 03/07/2017  . (No Known Allergies)    Vitals: BP 118/80   Pulse (!) 54   Wt 195 lb 8 oz (88.7 kg)   BMI 25.79 kg/m  Last Weight:  Wt Readings from Last 1 Encounters:  03/07/17 195 lb 8 oz (88.7 kg)   Last Height:   Ht Readings from Last  1 Encounters:  08/23/16 6\' 1"  (1.854 m)   Vision Screening:  Left eye with correction .  Right eye with correction  20/30 .  Uses the same glasses for 7 years.   Physical exam:  General: The patient is awake, alert and appears not in acute distress. The patient is well groomed. Head: Normocephalic, atraumatic. Neck is supple. Mallampati 3 , neck circumference: 38 cm  Cardiovascular:  Regular rate and rhythm, without  murmurs or carotid bruit, and without distended neck veins. Respiratory: Lungs are clear to auscultation. Skin:  Without evidence of edema, or rash Trunk: BMI is normal.  Neurologic exam : Cranial nerves: Pupils are equal and briskly reactive to light. . Visual fields by finger perimetry are intact. Facial motor strength is symmetric and tongue and uvula move midline.  Motor exam:   Normal tone and normal muscle bulk and symmetric normal strength in all extremities. Assessment:  After physical and neurologic examination, review of laboratory studies, imaging, neurophysiology testing and pre-existing records,  OSA on CPAP -  97% compliance, overall benefit for the patient is high, he likes CPAP and sleeps better , deeper.   Plan:  Treatment plan - continued use of CPAP , unchanged in settings, from now on one yearly visit.   [employer] [facility] is following him.      Cc Dr. Virgina Jock

## 2017-09-19 ENCOUNTER — Encounter: Payer: Self-pay | Admitting: Sports Medicine

## 2017-09-19 ENCOUNTER — Ambulatory Visit (INDEPENDENT_AMBULATORY_CARE_PROVIDER_SITE_OTHER): Payer: Managed Care, Other (non HMO) | Admitting: Sports Medicine

## 2017-09-19 VITALS — BP 112/68 | HR 63 | Ht 73.0 in | Wt 190.0 lb

## 2017-09-19 DIAGNOSIS — M7541 Impingement syndrome of right shoulder: Secondary | ICD-10-CM

## 2017-09-19 DIAGNOSIS — M25511 Pain in right shoulder: Secondary | ICD-10-CM

## 2017-09-19 NOTE — Patient Instructions (Signed)
-   Compete exercises that were demonstrated during visit

## 2017-09-19 NOTE — Progress Notes (Signed)
  Jacob Lozano - 62 y.o. male MRN [id number]  Date of birth: 02-07-1956  SUBJECTIVE:  Including CC & ROS.  CC: R shoulder pain  Patient reports that R shoulder pain started 8 years ago. However, it has been worsening over the past 2 months.The pain is intermittent and described a dull ache, 5-6/10 on pain scale. Pain does not radiate. Pain is worse when lifting his L arm above head and with push ups. He tried ibuprofen, which provides some improvement. Pain does not wake him up at night time. He exercises with a personal trainer (mainly strength training and stretching) twice a week and cycles once a week. Denies any numbness or tingling. No weakness. No swelling or numbness.   Note in past he had impingement related to scapular protraction that improved with scap stab exercises  He reports that his exercise routine recently changed. He is doing more exercises that involve his shoulder. Denies any injuries to his R shoulder. No history of R shoulder surgery.    ROS: No unexpected weight loss, fever, chills, swelling, instability, muscle pain, numbness/tingling, redness, otherwise see HPI   PMHx - Updated and reviewed.  Contributory factors include: Negative PSHx - Updated and reviewed.  Contributory factors include:  Negative FHx - Updated and reviewed.  Contributory factors include:  Negative Social Hx - Updated and reviewed. Contributory factors include: Negative Medications - reviewed    PHYSICAL EXAM:  VS: BP:112/68  HR:63bpm  TEMP: ( )  RESP:   HT:6\' 1"  (185.4 cm)   WT:190 lb (86.2 kg)  BMI:25.07 PHYSICAL EXAM: Gen: NAD, alert, cooperative with exam, well-appearing HEENT: clear conjunctiva,  CV:  no edema, capillary refill brisk, normal rate Resp: non-labored Neuro: no gross deficits.   R Shoulder: Inspection reveals no abnormalities, atrophy or asymmetry. Palpation is with tenderness over the anterior shoulder. Tenderness with external rotation and abduction noted. ROM  is full in all planes Rotator cuff strength normal throughout. Normal scapular function observed but tends toward scapular protraction and poor posture  Ultrasound: Limited ultrasound of his right shoulder performed. AC joint is negative for effusion. He has no joint space narrowing. No spurring noted. Biceps tendon is with normal fibrillar pattern without any swelling or tears. Subscapularis is without impingement and has normal fibular if pattern. Supraspinatus is without any tearing and has a normal fibular pattern. Infraspinatus and teres minor without any tearing or fibrillary changes.   ASSESSMENT & PLAN:  The patient's history, physical exam and imaging was significant with shoulder impingement. This is most likely due to poor posture.    Plan: - Compete exercises that were demonstrated during visit. These exercises will help strengthen back muscles and improve posture.   I observed and examined the patient with the resident and agree with assessment and plan.  Note reviewed and modified by me. Grace Bushy

## 2017-09-19 NOTE — Assessment & Plan Note (Signed)
Resume scapular stabilization exercises  After long bike rides - t and H stretches for 10 minutes  Reassured no RC damage

## 2017-11-21 ENCOUNTER — Ambulatory Visit (INDEPENDENT_AMBULATORY_CARE_PROVIDER_SITE_OTHER): Payer: Managed Care, Other (non HMO) | Admitting: Sports Medicine

## 2017-11-21 ENCOUNTER — Encounter: Payer: Self-pay | Admitting: Sports Medicine

## 2017-11-21 DIAGNOSIS — M7651 Patellar tendinitis, right knee: Secondary | ICD-10-CM | POA: Diagnosis not present

## 2017-11-21 DIAGNOSIS — M7741 Metatarsalgia, right foot: Secondary | ICD-10-CM

## 2017-11-21 DIAGNOSIS — M25561 Pain in right knee: Secondary | ICD-10-CM

## 2017-11-21 NOTE — Assessment & Plan Note (Signed)
Patient here with sxs consistent with metatarsalia 2/2 transverse arch collapse.  - Metatarsal pads and green shoe inserts provided today.  - OTC NSAIDs PRN - F/u PRN

## 2017-11-21 NOTE — Assessment & Plan Note (Signed)
Signs and Sxs consistent with patellar tendonitis. No injury. Likely overuse related as patient is an avid cyclist.  - Patellar strap - Regular Ice after exercise - OTC NSAIDs PRN - F/u PRN

## 2017-11-21 NOTE — Progress Notes (Signed)
HPI  CC: RT  foot pain and RTt knee pain Patient is here with complaints of right-sided lateral foot pain and anterior left knee pain over the past 2-3 weeks.  He denies any injury, trauma, or event which caused either of these issues.  Regarding the rt foot he states that pain is located along the plantar aspect of his lateral foot.  Pain first began while on a long walk with his wife.  Pain became intolerable to the point where he had to discontinue this walk but symptoms quickly improved over the following 2-3 hours, and completely resolved over the following few days.  He denies any swelling, ecchymosis, or significant erythema.  Prolonged cycling had eventually caused a recurrence of this discomfort but never to the extent in which the walking had caused it.  Regarding patient's anterior rt knee pain he states that this also began a few weeks ago.  Pain is much less inhibiting than the pain in his foot.  It is located along the anterior aspect of the knee near the tibial tuberosity.  It is described as occasionally sharp.  He denies any mechanical locking, clicking, or catching. No erythema, ecchymosis, or effusion.  No weakness, numbness, or paresthesias.  Medications/Interventions Tried: Relative rest  See HPI and/or previous note for associated ROS.  Objective: BP 132/90   Ht 6\' 1"  (1.854 m)   Wt 190 lb (86.2 kg)   BMI 25.07 kg/m  Gen: NAD, well groomed, a/o x3, normal affect.  CV: Well-perfused. Warm.  Resp: Non-labored.  Neuro: Sensation intact throughout. No gross coordination deficits.  Gait: Nonpathologic posture, unremarkable stride without signs of limp or balance issues. Ankle/Foot, RT: TTP noted at the plantar aspect of the fourth and fifth metatarsal and fourth and fifth toe pad. No swelling, ecchymosis, or bony deformity.  Slight erythema over the dorsal aspect of the fourth and fifth digits.  No notable pes planus/cavus deformity. Transverse arch grossly collapsed  compared to the left.  No evidence of tibiotalar deviation; Range of motion is full in all directions. Strength is 5/5 in all directions; No peroneal tendon tenderness or subluxation; No tenderness on posterior aspects of lateral and medial malleolus; Stable lateral and medial ligaments; Talar dome nontender; Unremarkable calcaneal squeeze; No plantar calcaneal tenderness; No tenderness over the navicular prominence; No tenderness over cuboid; No pain at base of 5th MT; Able to walk 4 steps.  Knee, Rt: TTP noted at the distal aspect of the patellar tendon. Inspection was negative for erythema, ecchymosis, and effusion. No obvious bony abnormalities or signs of osteophyte development. Palpation yielded no asymmetric warmth; No joint line tenderness; No patellar crepitus. No tenderness of the pes anserine bursa. ROM normal in flexion (135 degrees) and extension (0 degrees). Normal hamstring and quadriceps strength. Neurovascularly intact bilaterally.  - Ligaments: (Solid and consistent endpoints)   - ACL (present bilaterally)   - PCL (present bilaterally)   - LCL (present bilaterally)   - MCL (present bilaterally).   - Patella:   - Patellar grind/compression: NEG   - Patellar glide: Without apprehension  ULTRASOUND: Knee, Rt Diagnostic limited ultrasound imaging obtained of patient's Left knee.  - Patellar tendon: Mild to moderate evidence of calcific tendinosis noted at the distal insertion of the patellar tendon. No acute signs of tearing, or edema. No infrapatellar fluid appreciated.  -Tibial tuberosity: Slight irregularity to the tibial tuberosity likely secondary to a history of undiagnosed Osgood-Schlatter's as a child.  No increased neovascularity or fluid development appreciated.  IMPRESSION: findings consistent with calcific tendinitis of the patellar tendon insertion.  And incidental abnormality of the tibial tuberosity likely related to a history of Osgood-Schlatter's disease.   Assessment  and plan:  Metatarsalgia of left foot Patient here with sxs consistent with metatarsalia 2/2 transverse arch collapse.  - Metatarsal pads and green shoe inserts provided today.  - OTC NSAIDs PRN - F/u PRN  Patellar tendonitis of left knee Signs and Sxs consistent with patellar tendonitis. No injury. Likely overuse related as patient is an avid cyclist.  - Patellar strap - Regular Ice after exercise - OTC NSAIDs PRN - F/u PRN   Elberta Leatherwood, MD,MS [place] [facility] Fellow 11/21/2017 7:08 PM I observed and examined the patient with the [facility] fellow and agree with assessment and plan.  Note reviewed and modified by me. Stefanie Libel, MD

## 2017-12-23 ENCOUNTER — Encounter: Payer: Self-pay | Admitting: Sports Medicine

## 2017-12-23 ENCOUNTER — Ambulatory Visit (INDEPENDENT_AMBULATORY_CARE_PROVIDER_SITE_OTHER): Payer: Managed Care, Other (non HMO) | Admitting: Sports Medicine

## 2017-12-23 DIAGNOSIS — M7741 Metatarsalgia, right foot: Secondary | ICD-10-CM

## 2017-12-23 NOTE — Progress Notes (Signed)
    Date of Visit: 12/23/2017   HPI:  Jacob Lozano is a 62 y.o. male who presents for follow-up of his metatarsalgia. During his last visit metatarsal pad and green shoe inserts were provided. Today he reports significant improvement in the pain that he was experiencing in his 4th and 5th digit. He has been able to return to cycling as well as his normal daily activities without discomfort. He has not required the use of OTC pain medications. He denies any numbness or tingling in his forefoot. He denies any swelling, ecchymosis, or significant erythema. Today he is requesting additional heel lifts and metatarsal pads for his other shoes.  ROS: See HPI.   PHYSICAL EXAM: BP 124/86   Ht 6\' 1"  (1.854 m)   Wt 190 lb (86.2 kg)   BMI 25.07 kg/m  Gen: well appearing, no acute distress.  Heart: warm, well-perfused Lungs: non-labored Neuro: sensation intact throughout. Ankle/Foot: No swelling, ecchymosis, or bony deformity. No notable pes planus/cavus deformity. Right transverse arch collapsed compared to the left. Mild TTP, rated 2-3/10 noted at plantar aspect of the fourth and fifth metatarsal and toe pad. No pain with metatarsal squeeze. No tenderness on posterior aspects of lateral and medial malleolus. No laxity with anterior drawer or talar tilt. No plantar calcaneal tenderness. No tenderness over the navicular prominence. No tenderness over cuboid. No pain at base of 5th MT. Range of motion is full in all directions. Strength is 5/5 in all directions.    ASSESSMENT/PLAN:  Metatarsalgia of Left Foot Jacob Lozano is a 62 y.o. male who presents for follow-up of metatarsalgia with significant improvement following placement of metatarsal pad and heel lift. He denies significant pain with activities. He is able to function at his desired level at this time. We provided him with 4 additional metatarsal pads and green shoe inserts today. We also provided an additional heel lift. He may f/u PRN. He  requests re-evaluation in 4 weeks for various joint pains.   FOLLOW UP: F/u PRN. Per patient request, he plans to f/u in 4 weeks for reassessment of various joint pains.   Reinaldo Raddle, Medical Student  I observed and examined the patient with the resident and agree with assessment and plan.  Note reviewed and modified by me. All significant points repeated by me. Stefanie Libel, MD

## 2017-12-23 NOTE — Assessment & Plan Note (Signed)
Excellent response to MT padding  He needs to continue these in all shoes  We placed them in his regular dhoes today

## 2017-12-24 ENCOUNTER — Encounter

## 2017-12-24 ENCOUNTER — Ambulatory Visit: Payer: Managed Care, Other (non HMO) | Admitting: Sports Medicine

## 2018-03-11 ENCOUNTER — Ambulatory Visit: Payer: Managed Care, Other (non HMO) | Admitting: Neurology

## 2018-03-18 ENCOUNTER — Ambulatory Visit (INDEPENDENT_AMBULATORY_CARE_PROVIDER_SITE_OTHER): Payer: Managed Care, Other (non HMO) | Admitting: Neurology

## 2018-03-18 ENCOUNTER — Encounter: Payer: Self-pay | Admitting: Neurology

## 2018-03-18 VITALS — BP 151/87 | HR 46 | Ht 73.0 in | Wt 200.0 lb

## 2018-03-18 DIAGNOSIS — Z9989 Dependence on other enabling machines and devices: Secondary | ICD-10-CM

## 2018-03-18 DIAGNOSIS — G4733 Obstructive sleep apnea (adult) (pediatric): Secondary | ICD-10-CM | POA: Diagnosis not present

## 2018-03-18 NOTE — Progress Notes (Signed)
[facility]  Provider:  Dr Henok Heacock Referring Provider: Shon Baton, MD Primary Care Physician:  Shon Baton, MD  Chief Complaint  Patient presents with  . Follow-up    pt alone, rm 10. pt states machine is working well. no concerns. DME [facility]    HPI:  Jacob Lozano is a 62 y.o. male here as a referral from Dr. Virgina Jock for CPAP follow up.  03-18-2018, RV with CPAP compliance. Recovered by now completely from his injury. 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  Looks great his compliance is is usually excellent, he has used the device 87% and each of these nights over 4 hours, he has also used his auto CPAP with a mean pressure of 7.7 cmH2O the average pressure at night is 9.4 and the average time in large leak is 0 seconds (which is a very good result )residual AHI is around 4.0  also considering hypopneas  Minimal pressure is 5 cm , the maximum pressure is 12 cm.  There is an EPR of 1 cm water is using the humidifier at settings 3.   In January this year 2018 he had a gastrointestinal viral infect and was very sick. This is a six-month revisit after the first 30 days but in January 2014 the patient was placed on an auto-titrator CPAP for 30 days, after a HST confirmed OSA . Jacob Lozano is an ambidextrous, caucasian married gentleman, who was originally referred for loud snoring and sleep talking and his wife had witnessed apneas.  He may have trouble  falling asleep or staying asleep. He does not use caffeine unless a.m. he drinks about 6 beers weekly.  His  sleep time is between 10:30 and 6:30 AM, and he rarely has nocturia. And also has no jaw or neck surgery or surgical alteration of the upper airway. Patient was in January 2014 seen for a CPAP follow up- at the time he was 97% compliant -reported that he was looking forward to using the machine,  and felt more refreshed in the morning the auto- titration followed a home sleep test, that  revealed an AHI of 19 in  December 2013. The patient endorsed the Epworth Sleepiness Scale at 4 points and the fatigue severity scale at 18 points in his last visit 6 month ago.  The download shows 100% compliance and residual AHI of 4.3 and average user time of 7 hours and 14 minutes. This is fairly similar to the results of generally. The average device pressure is 8.4 cm the patient reports that he actually looks forward to wearing his CPAP as he is followed by a [facility] in [place] for his DME needs-  He is using a nasal comfort mask, not a nasal pillow.  Cigna downloads available today, 03-03-14, AHI residual at 3.8 and user time 6 hours and 34 minutes, 100% compliance for 30 days.  Patient has traits of OCD , ADHD - and focusses much better since on CPAP. He has never used SSRIs.   03-09-15 Epworth sleepiness score endorsed at 5 points and fatigue severity at 19 points. Only during February of this year that the patient have a period of " noncompliance "but he suffered from a sinus and upper respiratory tract infection. Otherwise he has been a compliant user and he has seen the difference that the CPAP use mates in his daytime alertness and productivity. He has had 4 nights of insomnia a year, he  dreams , he is refreshed in AM.  He takes antihistamines for seasonal rhinitis.  The download reveals an average use of the CPAP machine for 6 hours and 13 minutes per night. The average hypotony index is around 3.0 there are very few true apneas measured. The residual AHI for the night from 7:15 09/26/2014 for example at a complete AHI of 2.5 and an average also pressure of 5.8. At the 90th percentile pressure the patient uses 8 cm water. Since this is an auto VPAP the patient can continue with the current settings that needs to be no adjustments made. He  Will get new supplies from his DME, namely [facility] based in [place], [place].  Interval history from 03/05/2016. We are here for  her yearly compliance review with [name] his compliance has been excellent at 97% with an average user time of 7 hours and 4 minutes and a residual AHI of 3.7. He is using the same humidification settings he has a brief ramp time of 5 minutes and uses a minimum pressure of 4 and a maximum pressure of 12 cm water. Based on these results is no adjustment to be made. He enjoys his new home of the last year, still renovating.   Interval history from 03/07/2017, Jacob Lozano is here today for his routine compliance visit. He is a highly compliant CPAP user with 97% compliance over the last 30 days, average user time 7 hours and 4 minutes at night, his average AHI is 3.7 he does not have high air leaks, he is using an auto CPAP with a 90 percentile pressure of 8.6 cm water. There is no evidence of central apneas emerging under treatment. He does not have major air leaks.     Review of Systems: Out of a complete 14 system review, the patient complains of only the following symptoms, and all other reviewed systems are negative. Epworth 3 , fatigue severity score ( FSS) 16.   Pain with bicycling , had a muscle tear since 2014 , not completely resolved.    Social History   Socioeconomic History  . Marital status: Married    Spouse name: [name]  . Number of children: 0  . Years of education: BSBA  . Highest education level: Not on file  Occupational History    Employer: [employer]  Social Needs  . Financial resource strain: Not on file  . Food insecurity:    Worry: Not on file    Inability: Not on file  . Transportation needs:    Medical: Not on file    Non-medical: Not on file  Tobacco Use  . Smoking status: Never Smoker  . Smokeless tobacco: Never Used  Substance and Sexual Activity  . Alcohol use: Yes    Alcohol/week: 0.0 oz  . Drug use: No  . Sexual activity: Not on file  Lifestyle  . Physical activity:    Days per week: Not on file    Minutes per session:  Not on file  . Stress: Not on file  Relationships  . Social connections:    Talks on phone: Not on file    Gets together: Not on file    Attends religious service: Not on file    Active member of club or organization: Not on file    Attends meetings of clubs or organizations: Not on file    Relationship status: Not on file  . Intimate partner violence:    Fear of current or ex  partner: Not on file    Emotionally abused: Not on file    Physically abused: Not on file    Forced sexual activity: Not on file  Other Topics Concern  . 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   Patient is working full-time.   Patient has a BSBA degree.   Patient is ambi-dextrous.   Patient drinks two cups of tea and 1/2 cup of soda and nothing after 1 pm.    Family History  Problem Relation Age of Onset  . Heart disease Father   . Hypertension Father   . Diabetes Neg Hx   . Hyperlipidemia Neg Hx   . Sudden death Neg Hx     Past Medical History:  Diagnosis Date  . Cancer ([place]) 2012   basal cell ca on nose  . Hernia   . Hyperlipidemia   . Obstructive apnea    CPAP   . Ulcer     Past Surgical History:  Procedure Laterality Date  . APPENDECTOMY  2010  . HERNIA REPAIR  03/22/2011   Lap bilateral obturator & femoral hernia repairs    Current Outpatient Medications  Medication Sig Dispense Refill  . atorvastatin (LIPITOR) 20 MG tablet Take 20 mg by mouth. Taking every 3 days    . lansoprazole (PREVACID) 15 MG capsule Take 15 mg by mouth daily at 12 noon.    . Nutritional Supplements (JUICE PLUS FIBRE PO) Take 3 capsules by mouth daily.     No current facility-administered medications for this visit.     Allergies as of 03/18/2018  . (No Known Allergies)    Vitals: BP (!) 151/87   Pulse (!) 46   Ht 6\' 1"  (1.854 m)   Wt 200 lb (90.7 kg)   BMI 26.39 kg/m  Last Weight:  Wt Readings from Last 1 Encounters:  03/18/18 200  lb (90.7 kg)   Last Height:   Ht Readings from Last 1 Encounters:  03/18/18 6\' 1"  (1.854 m)   Vision Screening:  Left eye with correction .  Right eye with correction  20/30 .  Uses the same glasses for 7 years.   Physical exam:  General: The patient is awake, alert and appears not in acute distress. The patient is well groomed. Head: Normocephalic, atraumatic. Neck is supple. Mallampati 3 , neck circumference: 38 cm  Cardiovascular:  Regular rate and rhythm, without  murmurs or carotid bruit, and without distended neck veins. Respiratory: Lungs are clear to auscultation. Skin:  Without evidence of edema, or rash Trunk: BMI is normal.  Neurologic exam : Cranial nerves: Pupils are equal and briskly reactive to light. . Visual fields by finger perimetry are intact. Facial motor strength is symmetric and tongue and uvula move midline.  Motor exam:   Normal tone and normal muscle bulk and symmetric normal strength in all extremities. Assessment:  After physical and neurologic examination, review of laboratory studies, imaging, neurophysiology testing and pre-existing records,  OSA on CPAP - 87% compliance, overall benefit for the patient is high, he likes CPAP and sleeps better and  deeper.   Plan:  Treatment plan - continued use of CPAP , unchanged in settings, from now on one yearly visit.   [facility] [facility] is following him.    Larey Seat, MD   Cc Dr. Virgina Jock

## 2018-03-18 NOTE — Patient Instructions (Signed)
Hypersomnia Hypersomnia is when you feel extremely tired during the day even though you're getting plenty of sleep at night. You may need to take naps during the day, and you may also be extremely difficult to wake up when you are sleeping. What are the causes? The cause of your hypersomnia may not be known. Hypersomnia may be caused by:  Medicines.  Sleep disorders, such as narcolepsy.  Trauma or injury to your head or nervous system.  Using drugs or alcohol.  Tumors.  Medical conditions, such as depression or hypothyroidism.  Genetics.  What are the signs or symptoms? The main symptoms of hypersomnia include:  Feeling extremely tired throughout the day.  Being very difficult to wake up.  Sleeping for longer and longer periods.  Taking naps throughout the day.  Other symptoms may include:  Feeling: ? Restless. ? Annoyed. ? Anxious. ? Low energy.  Having difficulty: ? Remembering. ? Speaking. ? Thinking.  Losing your appetite.  Experiencing hallucinations.  How is this diagnosed? Hypersomnia may be diagnosed by:  Medical history and physical exam. This will include a sleep history.  Completing sleep logs.  Tests may also be done, such as: ? Polysomnography. ? Multiple sleep latency test (MSLT).  How is this treated? There is no cure for hypersomnia, but treatment can be very effective in helping manage the condition. Treatment may include:  Lifestyle and sleeping strategies to help cope with the condition.  Stimulant medicines.  Treating any underlying causes of hypersomnia.  Follow these instructions at home:  Take medicines only as directed by your health care provider.  Schedule short naps for when you feel sleepiest during the day. Tell your employer or teachers that you have hypersomnia. You may be able to adjust your schedule to include time for naps.  Avoid drinking alcohol or caffeinated beverages.  Do not eat a heavy meal before  bedtime. Eat at about the same times every day.  Do not drive or operate heavy machinery if you are sleepy.  Do not swim or go out on the water without a life jacket.  If possible, adjust your schedule so that you do not have to work or be active at night.  Keep all follow-up visits as directed by your health care provider. This is important. Contact a health care provider if:  You have new symptoms.  Your symptoms get worse. Get help right away if: You have serious thoughts of hurting yourself or someone else. This information is not intended to replace advice given to you by your health care provider. Make sure you discuss any questions you have with your health care provider. Document Released: 07/27/2002 Document Revised: 01/12/2016 Document Reviewed: 03/11/2014 Elsevier Interactive Patient Education  2018 Elsevier Inc.  

## 2019-03-10 ENCOUNTER — Telehealth: Payer: Self-pay | Admitting: Neurology

## 2019-03-10 NOTE — Telephone Encounter (Signed)
I called patient regarding rescheduling his 8/3 appointment due to Dr. Brett Fairy vacation. LVM requesting patient call back to reschedule. Patient may see NP Amy instead if Dr. Brett Fairy is booked.

## 2019-03-23 ENCOUNTER — Ambulatory Visit: Payer: Managed Care, Other (non HMO) | Admitting: Neurology

## 2019-05-28 ENCOUNTER — Telehealth: Payer: Self-pay | Admitting: Neurology

## 2019-05-28 NOTE — Telephone Encounter (Signed)
I called patient and LVM regarding rescheduling 10/22 appt d/t MD being out. Requested patient call back to reschedule. Patient can be scheduled with Amy.

## 2019-06-02 NOTE — Telephone Encounter (Signed)
VM x2 left for pt regarding r/s appt due to MD out-  appt has been c/a

## 2019-06-11 ENCOUNTER — Ambulatory Visit: Payer: Managed Care, Other (non HMO) | Admitting: Neurology

## 2019-06-18 ENCOUNTER — Other Ambulatory Visit: Payer: Self-pay

## 2019-06-18 ENCOUNTER — Encounter: Payer: Self-pay | Admitting: Family Medicine

## 2019-06-18 ENCOUNTER — Ambulatory Visit (INDEPENDENT_AMBULATORY_CARE_PROVIDER_SITE_OTHER): Payer: Managed Care, Other (non HMO) | Admitting: Family Medicine

## 2019-06-18 VITALS — BP 130/80 | HR 62 | Temp 97.4°F | Ht 73.0 in | Wt 190.4 lb

## 2019-06-18 DIAGNOSIS — Z9989 Dependence on other enabling machines and devices: Secondary | ICD-10-CM | POA: Diagnosis not present

## 2019-06-18 DIAGNOSIS — G4733 Obstructive sleep apnea (adult) (pediatric): Secondary | ICD-10-CM | POA: Diagnosis not present

## 2019-06-18 NOTE — Progress Notes (Signed)
PATIENT: Jacob Lozano DOB: 1956-08-13  REASON FOR VISIT: follow up HISTORY FROM: patient  Chief Complaint  Patient presents with   Follow-up    Room 1, alone. Very restless. Wears every night.     HISTORY OF PRESENT ILLNESS: Today 06/18/19 Jacob Lozano is a 63 y.o. male here today for follow up for OSA on CPAP. He is doing well.  He continues CPAP nightly and for greater than 4 hours each night.  He admits to more stress recently due to pandemic and changes within his place of employment.  He has a history of stomach ulcers thought to be induced by anxiety.  He continues Prilosec daily.  He follows up with primary care and a holistic provider.  Compliance report dated 05/19/2019 through 06/17/2019 reveals that he has used CPAP every night for compliance of 100%.  Every night he is used CPAP greater than 4 hours for compliance of 100%.  Average usage was 8 hours and 16 minutes.  AHI was 4.5 on 4 to 12 cm of water.  There was no significant leak noted.  HISTORY: (copied from Dr Dohmeier's note on 03/18/2018)  HPI:  Jacob Lozano is a 63 y.o. male here as a referral from Dr. Virgina Jock for CPAP follow up.  03-18-2018, RV with CPAP compliance. Recovered by now completely from his injury. 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  Looks great his compliance is is usually excellent, he has used the device 87% and each of these nights over 4 hours, he has also used his auto CPAP with a mean pressure of 7.7 cmH2O the average pressure at night is 9.4 and the average time in large leak is 0 seconds (which is a very good result )residual AHI is around 4.0  also considering hypopneas  Minimal pressure is 5 cm , the maximum pressure is 12 cm.  There is an EPR of 1 cm water is using the humidifier at settings 3.   In January this year 2018 he had a gastrointestinal viral infect and was very sick. This is a six-month revisit after the first 30 days but in January  2014 the patient was placed on an auto-titrator CPAP for 30 days, after a HST confirmed OSA . Jacob Lozano is an ambidextrous, caucasian married gentleman, who was originally referred for loud snoring and sleep talking and his wife had witnessed apneas.  He may have trouble  falling asleep or staying asleep. He does not use caffeine unless a.m. he drinks about 6 beers weekly.  His  sleep time is between 10:30 and 6:30 AM, and he rarely has nocturia. And also has no jaw or neck surgery or surgical alteration of the upper airway. Patient was in January 2014 seen for a CPAP follow up- at the time he was 97% compliant -reported that he was looking forward to using the machine,  and felt more refreshed in the morning the auto- titration followed a home sleep test, that revealed an AHI of 19 in  December 2013. The patient endorsed the Epworth Sleepiness Scale at 4 points and the fatigue severity scale at 18 points in his last visit 6 month ago.  The download shows 100% compliance and residual AHI of 4.3 and average user time of 7 hours and 14 minutes. This is fairly similar to the results of generally. The average device pressure is 8.4 cm the patient reports that he actually looks forward  to wearing his CPAP as he is followed by a [facility] in [place] for his DME needs-  He is using a nasal comfort mask, not a nasal pillow.  Cigna downloads available today, 03-03-14, AHI residual at 3.8 and user time 6 hours and 34 minutes, 100% compliance for 30 days.  Patient has traits of OCD , ADHD - and focusses much better since on CPAP. He has never used SSRIs.   03-09-15 Epworth sleepiness score endorsed at 5 points and fatigue severity at 19 points. Only during February of this year that the patient have a period of " noncompliance "but he suffered from a sinus and upper respiratory tract infection. Otherwise he has been a compliant user and he has seen the difference that the CPAP use mates in his daytime  alertness and productivity. He has had 4 nights of insomnia a year, he dreams , he is refreshed in AM.  He takes antihistamines for seasonal rhinitis.  The download reveals an average use of the CPAP machine for 6 hours and 13 minutes per night. The average hypotony index is around 3.0 there are very few true apneas measured. The residual AHI for the night from 7:15 09/26/2014 for example at a complete AHI of 2.5 and an average also pressure of 5.8. At the 90th percentile pressure the patient uses 8 cm water. Since this is an auto VPAP the patient can continue with the current settings that needs to be no adjustments made. He  Will get new supplies from his D[facility], [facility] based in [place], [place].  Interval history from 03/05/2016. We are here for her yearly compliance review with Jacob Lozano his compliance has been excellent at 97% with an average user time of 7 hours and 4 minutes and a residual AHI of 3.7. He is using the same humidification settings he has a brief ramp time of 5 minutes and uses a minimum pressure of 4 and a maximum pressure of 12 cm water. Based on these results is no adjustment to be made. He enjoys his new home of the last year, still renovating.   Interval history from 03/07/2017, Jacob Lozano is here today for his routine compliance visit. He is a highly compliant CPAP user with 97% compliance over the last 30 days, average user time 7 hours and 4 minutes at night, his average AHI is 3.7 he does not have high air leaks, he is using an auto CPAP with a 90 percentile pressure of 8.6 cm water. There is no evidence of central apneas emerging under treatment. He does not have major air leaks.   REVIEW OF SYSTEMS: Out of a complete 14 system review of symptoms, the patient complains only of the following symptoms, none and all other reviewed systems are negative.  ALLERGIES: No Known Allergies  HOME MEDICATIONS: Outpatient Medications Prior to Visit    Medication Sig Dispense Refill   lansoprazole (PREVACID) 15 MG capsule Take 15 mg by mouth daily at 12 noon.     Melatonin 2.5 MG CHEW Chew by mouth. Olly Melatonin for sleep. 5 nights a week     atorvastatin (LIPITOR) 20 MG tablet Take 20 mg by mouth. Taking every 3 days     Nutritional Supplements (JUICE PLUS FIBRE PO) Take 3 capsules by mouth daily.     No facility-administered medications prior to visit.     PAST MEDICAL HISTORY: Past Medical History:  Diagnosis Date   Cancer ([place]) 2012   basal cell ca  on nose   Hernia    Hyperlipidemia    Obstructive apnea    CPAP    Ulcer     PAST SURGICAL HISTORY: Past Surgical History:  Procedure Laterality Date   APPENDECTOMY  2010   HERNIA REPAIR  03/22/2011   Lap bilateral obturator & femoral hernia repairs    FAMILY HISTORY: Family History  Problem Relation Age of Onset   Heart disease Father    Hypertension Father    Diabetes Neg Hx    Hyperlipidemia Neg Hx    Sudden death Neg Hx     SOCIAL HISTORY: Social History   Socioeconomic History   Marital status: Married    Spouse name: [name]   Number of children: 0   Years of education: BSBA   Highest education level: Not on file  Occupational History    Employer: [employer]  Social Needs   Emergency planning/management officer strain: Not on file   Food insecurity    Worry: Not on file    Inability: Not on file   Transportation needs    Medical: Not on file    Non-medical: Not on file  Tobacco Use   Smoking status: Never Smoker   Smokeless tobacco: Never Used  Substance and Sexual Activity   Alcohol use: Yes    Alcohol/week: 0.0 standard drinks   Drug use: No   Sexual activity: Not on file  Lifestyle   Physical activity    Days per week: Not on file    Minutes per session: Not on file   Stress: Not on file  Relationships   Social connections    Talks on phone: Not on file    Gets together: Not on file    Attends religious  service: Not on file    Active member of club or organization: Not on file    Attends meetings of clubs or organizations: Not on file    Relationship status: Not on file   Intimate partner violence    Fear of current or ex partner: Not on file    Emotionally abused: Not on file    Physically abused: Not on file    Forced sexual activity: Not on file  Other Topics Concern   Not on file  Social History Narrative   Patient is married ([place]) and lives at home with his wife.   Patient is working full-time.   Patient has a BSBA degree.   Patient is ambi-dextrous.   Patient drinks two cups of tea and 1/2 cup of soda and nothing after 1 pm.      PHYSICAL EXAM  Vitals:   06/18/19 0825  BP: 130/80  Pulse: 62  Temp: (!) 97.4 F (36.3 C)  Weight: 190 lb 6.4 oz (86.4 kg)  Height: 6\' 1"  (1.854 m)   Body mass index is 25.12 kg/m.  Generalized: Well developed, in no acute distress  Cardiology: normal rate and rhythm, no murmur noted Respiratory: Clear to auscultation bilaterally Neurological examination  Mentation: Alert oriented to time, place, history taking. Follows all commands speech and language fluent Cranial nerve II-XII: Pupils were equal round reactive to light. Extraocular movements were full, visual field were full on confrontational test. Facial sensation and strength were normal. Uvula tongue midline. Head turning and shoulder shrug  were normal and symmetric. Motor: The motor testing reveals 5 over 5 strength of all 4 extremities. Good symmetric motor tone is noted throughout.  Gait and station: Gait is normal.   DIAGNOSTIC DATA (  LABS, IMAGING, TESTING) - I reviewed patient records, labs, notes, testing and imaging myself where available.  No flowsheet data found.   Lab Results  Component Value Date   WBC 9.3 08/02/2012   HGB 16.3 08/02/2012   HCT 46.8 08/02/2012   MCV 95.9 08/02/2012   PLT 183 08/02/2012      Component Value Date/Time   NA 134 (L)  08/02/2012 2006   K 3.4 (L) 08/02/2012 2006   CL 97 08/02/2012 2006   CO2 25 08/02/2012 2006   GLUCOSE 147 (H) 08/02/2012 2006   BUN 22 08/02/2012 2006   CREATININE 0.91 08/02/2012 2006   CALCIUM 8.9 08/02/2012 2006   GFRNONAA >90 08/02/2012 2006   GFRAA >90 08/02/2012 2006   No results found for: CHOL, HDL, LDLCALC, LDLDIRECT, TRIG, CHOLHDL No results found for: HGBA1C No results found for: VITAMINB12 No results found for: TSH   ASSESSMENT AND PLAN 63 y.o. year old male  has a past medical history of Cancer ([place]) (2012), Hernia, Hyperlipidemia, Obstructive apnea, and Ulcer. here with     ICD-10-CM   1. OSA on CPAP  G47.33    Z99.89     Jacob Lozano is doing very well from a CPAP perspective.  He continues nightly use.  Compliance report reveals excellent compliance.  He was encouraged to continue using CPAP nightly and for greater than 4 hours each night.  He has had more anxiety lately and feels that he is more restless at night.  He will continue close follow-up with primary care as well as his holistic provider for guidance and treatment of anxiety.  He will follow-up with me in 1 year, sooner if needed.  He verbalizes understanding and agreement with this plan.   No orders of the defined types were placed in this encounter.    No orders of the defined types were placed in this encounter.     I spent 15 minutes with the patient. 50% of this time was spent counseling and educating patient on plan of care and medications.    [name], FNP-C 06/18/2019, 1:46 PM [facility] [address] [place], [place] [address] [phone]

## 2019-06-25 ENCOUNTER — Other Ambulatory Visit: Payer: Self-pay | Admitting: Internal Medicine

## 2019-06-25 DIAGNOSIS — R109 Unspecified abdominal pain: Secondary | ICD-10-CM

## 2019-06-25 DIAGNOSIS — K529 Noninfective gastroenteritis and colitis, unspecified: Secondary | ICD-10-CM

## 2019-06-25 DIAGNOSIS — K2100 Gastro-esophageal reflux disease with esophagitis, without bleeding: Secondary | ICD-10-CM

## 2019-07-03 ENCOUNTER — Ambulatory Visit
Admission: RE | Admit: 2019-07-03 | Discharge: 2019-07-03 | Disposition: A | Discharge status: Home / Self Care | Payer: Managed Care, Other (non HMO) | Source: Ambulatory Visit | Attending: Internal Medicine | Admitting: Internal Medicine

## 2019-07-03 DIAGNOSIS — K2100 Gastro-esophageal reflux disease with esophagitis, without bleeding: Secondary | ICD-10-CM

## 2019-07-03 DIAGNOSIS — R109 Unspecified abdominal pain: Secondary | ICD-10-CM

## 2019-07-03 DIAGNOSIS — K529 Noninfective gastroenteritis and colitis, unspecified: Secondary | ICD-10-CM

## 2020-04-05 ENCOUNTER — Other Ambulatory Visit: Payer: Self-pay

## 2020-04-05 ENCOUNTER — Ambulatory Visit: Payer: Self-pay

## 2020-04-05 ENCOUNTER — Ambulatory Visit (INDEPENDENT_AMBULATORY_CARE_PROVIDER_SITE_OTHER): Payer: Managed Care, Other (non HMO) | Admitting: Sports Medicine

## 2020-04-05 VITALS — BP 122/78 | Ht 74.0 in | Wt 187.0 lb

## 2020-04-05 DIAGNOSIS — M25511 Pain in right shoulder: Secondary | ICD-10-CM | POA: Diagnosis not present

## 2020-04-05 DIAGNOSIS — M7541 Impingement syndrome of right shoulder: Secondary | ICD-10-CM | POA: Insufficient documentation

## 2020-04-05 MED ORDER — NITROGLYCERIN 0.2 MG/HR TD PT24: MEDICATED_PATCH | TRANSDERMAL | 1 refills | Status: DC

## 2020-04-05 NOTE — Patient Instructions (Signed)

## 2020-04-05 NOTE — Progress Notes (Signed)
PCP: Shon Baton, MD  Subjective:   CC: Patient is a 64 y.o. male here for worsening of chronic R shoulder pain.  HPI:  Seen for shoulder impingement about 2 years ago and has had chronic shoulder pain. About 6 months ago started getting worse with no known inciting event or injury. Has been stretching multiple times a day with no improvement. Mostly exacerbated by overhead reaching but also feels pain scarevely at rest in front of shoulder. Started playing a lot of ping pong recently.  Does a lot of biking in protracted shoulder position Crash onto left side last week but no falls onto RT shoulder     Objective:  BP 122/78   Ht 6\' 2"  (1.88 m)   Wt 187 lb (84.8 kg)   BMI 24.01 kg/m   Physical Exam: Gen: NAD, comfortable in exam room Right Shoulder: Observation: no abnormalities Palpation: no tenderness ROM: internal on back scratch limited by about 50% compared to left. Painful arc at 120 deg of elevation but able to complete full range actively Strength: good deltoid, abduction, IR and ER Special tests:  painful arc at 120 degrees on right. Pain with empty can and hawkins Negative neers   Ultrasound of Right Shoulder  Biceps tendon short and long appear normal Subscapularis normal and no subcoracoid impingement on rotation Supraspinatus normal appearance but some impingement on abduction noted Infraspinatus and teres minor intact.  Small hypoechoic swelling over [facility] joint Tendon popping on subacromial motion testing Calcification seen within supraspinatus tendon of rotator cuff  Impression: some dynamic impingement and minor changes in rotator cuff without tears or chronic tendinopathy  Ultrasound and interpretation Ermalene Searing, DO and by Wolfgang Phoenix. Luanne Krzyzanowski, MD   Assessment & Plan:  Impingement syndrome of right shoulder - Continue exercises given in hand out- gentle spokes, rower, and internal rotation - nitroglycerin protocol - follow up in 4-6 weeks   I  independently examined pertinent imaging in relation to cc.  Doristine Mango, [place] [facility] PGY-3  I observed and examined the patient with the resident and agree with assessment and plan.  Note reviewed and modified by me. Ila Mcgill, MD

## 2020-04-05 NOTE — Assessment & Plan Note (Signed)
-   Continue exercises given in hand out- gentle spokes, rower, and internal rotation - nitroglycerin protocol - follow up in 4-6 weeks

## 2020-05-17 ENCOUNTER — Other Ambulatory Visit: Payer: Self-pay

## 2020-05-17 ENCOUNTER — Ambulatory Visit (INDEPENDENT_AMBULATORY_CARE_PROVIDER_SITE_OTHER): Payer: Managed Care, Other (non HMO) | Admitting: Sports Medicine

## 2020-05-17 VITALS — BP 106/78 | Ht 73.0 in | Wt 185.0 lb

## 2020-05-17 DIAGNOSIS — M7541 Impingement syndrome of right shoulder: Secondary | ICD-10-CM | POA: Diagnosis not present

## 2020-05-17 NOTE — Assessment & Plan Note (Signed)
He continues to have signs of impingement No evidence of a tear on [place] Has failed to improve on HEP  Refer to formal PT Focus on scapular strength and posture He should improve over next 6 weeks  Stopped NTG as no tear and side-effects noted

## 2020-05-17 NOTE — Progress Notes (Signed)
CC; RT shoulder pain  Patient seen with impingment syndrome on 8/17 He has faithfully done his HEP However, he still has lots of pain and clicking in RT shoulder This comes with movement overhead or with reaching behind back Night pain has resolved No general weakness but rather pain limits certain activity  Wants to play ping pong and Ride backs without pain  ROS Denies neck pain Denies radicular sx  PE Pleasant M in NAD BP 106/78   Ht 6\' 1"  (1.854 m)   Wt 185 lb (83.9 kg)   BMI 24.41 kg/m   Shoulder: RT Inspection reveals no abnormalities, atrophy or asymmetry. Palpation is normal with no tenderness over AC joint or bicipital groove. ROM is limited in back scratch and slightly when reaching overhead Rotator cuff strength normal throughout. Positive signs of impingement with Neer and Hawkin's tests, empty can. Speeds and Yergason's tests normal. No labral pathology noted with negative Obrien's, negative clunk and good stability. Normal scapular function observed.  Painful arc at 80 to 120 deg abduction No apprehension sign  Repeat US Of RT shoulder BT intact and normal Subscap tendon normal Infraspinatus and Teres minor normal Supraspinatus normal Dynamic motion - possibly some impingment under acromium  Impression:  Impingement without [place] evidence of RC tear  Ultrasound and interpretation by Wolfgang Phoenix. Oneida Alar, MD

## 2020-06-03 ENCOUNTER — Encounter: Payer: Self-pay | Admitting: Physical Therapy

## 2020-06-03 ENCOUNTER — Other Ambulatory Visit: Payer: Self-pay

## 2020-06-03 ENCOUNTER — Ambulatory Visit: Payer: Managed Care, Other (non HMO) | Attending: Sports Medicine | Admitting: Physical Therapy

## 2020-06-03 DIAGNOSIS — M62838 Other muscle spasm: Secondary | ICD-10-CM | POA: Diagnosis present

## 2020-06-03 DIAGNOSIS — G8929 Other chronic pain: Secondary | ICD-10-CM

## 2020-06-03 DIAGNOSIS — M25511 Pain in right shoulder: Secondary | ICD-10-CM | POA: Diagnosis present

## 2020-06-03 NOTE — Therapy (Signed)
[place], [place], [address] Phone: [phone]   Fax:  [phone]  Physical Therapy Evaluation  Patient Details  Name: Jacob Lozano MRN: [id number] Date of Birth: 1956-05-05 Referring Provider (PT): Stefanie Libel, MD   Encounter Date: 06/03/2020   PT End of Session - 06/03/20 1027    Visit Number 1    Number of Visits 13    Date for PT Re-Evaluation 07/15/20    Authorization Type [employer]: FOTO at 6th and 10th visit    PT Start Time 1015    PT Stop Time 1059    PT Time Calculation (min) 44 min    Activity Tolerance Patient tolerated treatment well    Behavior During Therapy [facility] for tasks assessed/performed           Past Medical History:  Diagnosis Date   Cancer ([place]) 2012   basal cell ca on nose   Hernia    Hyperlipidemia    Obstructive apnea    CPAP    Ulcer     Past Surgical History:  Procedure Laterality Date   APPENDECTOMY  2010   HERNIA REPAIR  03/22/2011   Lap bilateral obturator & femoral hernia repairs    There were no vitals filed for this visit.    Subjective Assessment - 06/03/20 1018    Subjective pt is a 64 y.o with CC of R shoulder pain starting 7 years with no specific onset or cause. He reports the pain has gradually worsended in the front of the shoulder. He has been doing exercise but notes limiting improvement in the shoulder. Pain stays mostly in the shoulder and denies any referral.    Diagnostic tests [place] 05/17/2020 [place] BT intact and normalSubscap tendon normalInfraspinatus and Teres minor normalSupraspinatus normalDynamic motion - possibly some impingment under acromium    Patient Stated Goals being pain free, have full mobility, play ping pong without issues.    Currently in Pain? Yes    Pain Score 1    at worst 8/10   Pain Location Shoulder    Pain Orientation Right    Pain Descriptors / Indicators Aching    Pain Type Chronic pain    Pain Onset More  than a month ago    Pain Frequency Intermittent    Aggravating Factors  reaching over head and worse with reaching backward    Pain Relieving Factors getting out of position.    Effect of Pain on Daily Activities limited reaching especially back ward              [facility] PT Assessment - 06/03/20 0001      Assessment   Medical Diagnosis Impingement syndrome of right shoulder     Referring Provider (PT) Stefanie Libel, MD    Onset Date/Surgical Date --   7 years   Hand Dominance Right    Next MD Visit 07/09/2020    Prior Therapy yes      Precautions   Precautions None      Restrictions   Weight Bearing Restrictions No      Balance Screen   Has the patient fallen in the past 6 months No    Has the patient had a decrease in activity level because of a fear of falling?  No    Is the patient reluctant to leave their home because of a fear of falling?  No      Home Ecologist residence  Living Arrangements Spouse/significant other    Available Help at Discharge Family    Type of [place] to enter    Entrance Stairs-Number of Steps 2    Entrance Stairs-Rails None    Home Layout Multi-level    Alternate Level Stairs-Number of Steps 30    Home Equipment None      Prior Function   Level of Independence Independent    Vocation Full time Physiological scientist   Vocation Requirements prolonged sitting    Leisure ping pong,       Cognition   Overall Cognitive Status Within Functional Limits for tasks assessed      Observation/Other Assessments   Focus on Therapeutic Outcomes (FOTO)  51% function,   predicted 70% function     Posture/Postural Control   Posture/Postural Control Postural limitations    Postural Limitations Rounded Shoulders      ROM / Strength   AROM / PROM / Strength AROM;Strength      AROM   Overall AROM Comments neck ROM WFL     AROM Assessment Site Shoulder    Right/Left Shoulder  Right;Left    Right Shoulder Flexion --   WNL mild concordant pain during motion   Right Shoulder ABduction 82 Degrees   produced concordant pain during movement   Right Shoulder Internal Rotation --   S1   Right Shoulder External Rotation --   T5   Left Shoulder ABduction 140 Degrees    Left Shoulder Internal Rotation --   T10   Left Shoulder External Rotation --   T5     Strength   Strength Assessment Site Shoulder    Right/Left Shoulder Right;Left    Right Shoulder Flexion 4+/5    Right Shoulder Extension 5/5    Right Shoulder ABduction 4+/5    Right Shoulder Internal Rotation 5/5    Right Shoulder External Rotation 5/5    Left Shoulder Flexion 5/5    Left Shoulder Extension 5/5    Left Shoulder ABduction 5/5    Left Shoulder Internal Rotation 5/5    Left Shoulder External Rotation 5/5      Palpation   Palpation comment TTP along the long head  biceps tendon, and mutliple trigger points noted  Upper trap/ levator scapulae, bil Rhomboids, limited scapular control with shoulder abduciton with controlled lowring      Special Tests    Special Tests Rotator Cuff Impingement    Rotator Cuff Impingment tests Michel Bickers test;Full Can test;Empty Can test;other      Hawkins-Kennedy test   Findings Positive    Side Right      Empty Can test   Findings Positive    Side Right      Full Can test   Findings Negative      other   Comments scapular assist test                      Objective measurements completed on examination: See above findings.       [place] [place] Adult PT Treatment/Exercise - 06/03/20 0001      Exercises   Exercises Shoulder      Shoulder Exercises: Supine   Protraction Strengthening;Both;15 reps   maintained protraction with CW/CCW circles      Shoulder Exercises: Seated   Other Seated Exercises lower trap with elbows propped on table 1 x 15 with yellow band      Shoulder Exercises:  Stretch   Other Shoulder Stretches upper trap/  levator scapulae 2 x 30                  PT Education - 06/03/20 1133    Education Details evaluation findings, POC, goals, HEP with proper form/rationale, anatomy of the area involved and biomechanics.    Person(s) Educated Patient    Methods Explanation;Verbal cues;Handout    Comprehension Verbalized understanding;Verbal cues required            PT Short Term Goals - 06/03/20 1142      PT SHORT TERM GOAL #1   Title pt to be IND with inital HEP    Time 3    Period Weeks    Status New    Target Date 06/24/20             PT Long Term Goals - 06/03/20 1142      PT LONG TERM GOAL #1   Title pt to verbalize and demo efficient posture and lifting mechanics to reduce and prevent shoulder pain    Time 6    Period Weeks    Status New    Target Date 07/15/20      PT LONG TERM GOAL #2   Title increase R shoulder abduction to >/= 140 degrees and IR to >/= L1 to promote functional ROM required for ADLS with </= 8/11 pain    Time 6    Period Weeks    Status New    Target Date 07/15/20      PT LONG TERM GOAL #3   Title maintain R shoulder strength to 5/5 to and is able to maintain proper scapulohumeral rhythm and is able to play ping pong per pt's goals with no limitation.    Time 6    Period Weeks    Status New    Target Date 07/15/20      PT LONG TERM GOAL #4   Title increase FOTO score to >/= 70% function to demo improvement in function    Time 6    Period Weeks    Status New    Target Date 07/15/20      PT LONG TERM GOAL #5   Title pt to be I with all HEP and is able to maintain and progress current LOF IND    Time 6    Period Weeks    Status New    Target Date 07/15/20                  Plan - 06/03/20 1028    Clinical Impression Statement pt presents to OPPT with CC of chronic R shoulder pain starting 7 years with progressive worsening with no preceding event. He has funcitonal shoulder flexion bil with limited abduction/ and IR on the R with  reproduction of concordant pain located in the anterior aspect of the shoulder. TTP over the long head of the biceps tension with multiple trigger points in upper trap /levator scapulae/ rhomboids. Special testing and objective findings is consistent with dx of impingement. He would benefit from physical therapy to decrease R shoulder pain, improve shoulder ROM, return to functional exercise and activities iwth report of pain or limitations.    Stability/Clinical Decision Making Stable/Uncomplicated    Clinical Decision Making Low    Rehab Potential Good    PT Frequency 2x / week    PT Duration 6 weeks    PT Treatment/Interventions ADLs/Self Care Home Management;Cryotherapy;Electrical Stimulation;Iontophoresis 4mg /ml Dexamethasone;Moist  Heat;Traction;Ultrasound;Gait training;Therapeutic activities;Therapeutic exercise;Balance training;Neuromuscular re-education;Manual techniques;Dry needling;Patient/family education;Passive range of motion;Taping    PT Next Visit Plan review and update HEP PRN, review FOTO assessment. review DN for upper trap/ levator/ rhomboids on R and potential biceps brachii. continue working on scapular stability to promote scapulohumeral rhythm.    PT Home Exercise Plan [id number] - upper trap stretch, levator scapulae stretching, shoulder scaption, ceiling punches, lower trap stretching with elbows propped on table.    Consulted and Agree with Plan of Care Patient           Patient will benefit from skilled therapeutic intervention in order to improve the following deficits and impairments:     Visit Diagnosis: Chronic right shoulder pain  Other muscle spasm     Problem List Patient Active Problem List   Diagnosis Date Noted   Impingement syndrome of right shoulder 04/05/2020   Metatarsalgia of right foot 11/21/2017   Patellar tendinitis of right knee 11/21/2017   Pes anserine bursitis 08/23/2016   Arthralgia of right acromioclavicular joint 08/23/2016    Left Achilles tendinitis 01/18/2016   Medial epicondylitis of right elbow 07/18/2015   OSA on CPAP 03/03/2014   Pain in joint, shoulder region 06/03/2013   Left knee pain 05/19/2013   Sleep apnea with use of continuous positive airway pressure (CPAP) 02/26/2013   Obstructive apnea    Perineum pain, male 10/02/2011   HYPERLIPIDEMIA 01/27/2009   PEPTIC ULCER DISEASE 01/27/2009   BACK PAIN, LUMBAR 01/27/2009    Starr Lake PT, DPT, LAT, ATC  06/03/20  11:49 AM      [facility] [address] [place], [place], [address] Phone: [phone]   Fax:  [phone]  Name: RUTILIO YELLOWHAIR MRN: [id number] Date of Birth: [birth year]

## 2020-06-13 ENCOUNTER — Ambulatory Visit: Payer: Managed Care, Other (non HMO) | Admitting: Physical Therapy

## 2020-06-13 ENCOUNTER — Other Ambulatory Visit: Payer: Self-pay

## 2020-06-13 ENCOUNTER — Encounter: Payer: Self-pay | Admitting: Physical Therapy

## 2020-06-13 DIAGNOSIS — M62838 Other muscle spasm: Secondary | ICD-10-CM

## 2020-06-13 DIAGNOSIS — M25511 Pain in right shoulder: Secondary | ICD-10-CM | POA: Diagnosis not present

## 2020-06-13 DIAGNOSIS — G8929 Other chronic pain: Secondary | ICD-10-CM

## 2020-06-13 NOTE — Therapy (Signed)
[place], [place], [address] Phone: [phone]   Fax:  [phone]  Physical Therapy Treatment  Patient Details  Name: Jacob Lozano MRN: [id number] Date of Birth: 03/09/1956 Referring Provider (PT): Stefanie Libel, MD   Encounter Date: 06/13/2020   PT End of Session - 06/13/20 1543    Visit Number 2    Number of Visits 13    Date for PT Re-Evaluation 07/15/20    PT Start Time 1505    PT Stop Time 1540    PT Time Calculation (min) 35 min    Activity Tolerance Patient tolerated treatment well    Behavior During Therapy [facility] for tasks assessed/performed           Past Medical History:  Diagnosis Date  . Cancer ([place]) 2012   basal cell ca on nose  . Hernia   . Hyperlipidemia   . Obstructive apnea    CPAP   . Ulcer     Past Surgical History:  Procedure Laterality Date  . APPENDECTOMY  2010  . HERNIA REPAIR  03/22/2011   Lap bilateral obturator & femoral hernia repairs    There were no vitals filed for this visit.   Subjective Assessment - 06/13/20 1501    Subjective Pt reports good response to therapy last time    Limitations Lifting    Currently in Pain? Other (Comment)   0/10 currently 8/10 at worst for 15-20 minutes   Pain Score 0-No pain    Pain Location Shoulder    Pain Orientation Right    Pain Descriptors / Indicators Aching    Pain Onset More than a month ago    Pain Frequency Intermittent              [facility] PT Assessment - 06/13/20 0001      ROM / Strength   AROM / PROM / Strength PROM   R-ABD 78, R-Flexion 105     PROM   PROM Assessment Site Shoulder    Right/Left Shoulder Right    Right Shoulder Flexion 105 Degrees    Right Shoulder ABduction 78 Degrees                         [facility] Adult PT Treatment/Exercise - 06/13/20 0001      Neuro Re-ed    Neuro Re-ed Details  Diaphragmatic breathing retraining   right hand on diaphragm     Shoulder  Exercises: Seated   External Rotation AROM    External Rotation Limitations Wandx20    Internal Rotation AROM    Internal Rotation Limitations Wandx20      Shoulder Exercises: Standing   External Rotation AAROM;Strengthening;20 reps;Right    Theraband Level (Shoulder External Rotation) Level 2 (Red)    Internal Rotation AAROM;Right;20 reps    Theraband Level (Shoulder Internal Rotation) Level 2 (Red)    Extension AROM    Theraband Level (Shoulder Extension) Level 2 (Red)    Extension Weight (lbs) red    Extension Limitations none    Row AROM;Both;10 reps    Theraband Level (Shoulder Row) Level 2 (Red)      Manual Therapy   Joint Mobilization Grade I & II posterior and inferior; abduction grade I & II, LAD    Muscle Energy Technique Rhythmic stabilization to right shoulder                  PT Education - 06/13/20 1545  Education Details Reviewed HEP, anatomy and staying in the pain-free range during exercises; diaphragmatic breathing    Person(s) Educated Patient    Methods Explanation    Comprehension Verbalized understanding            PT Short Term Goals - 06/03/20 1142      PT SHORT TERM GOAL #1   Title pt to be IND with inital HEP    Time 3    Period Weeks    Status New    Target Date 06/24/20             PT Long Term Goals - 06/03/20 1142      PT LONG TERM GOAL #1   Title pt to verbalize and demo efficient posture and lifting mechanics to reduce and prevent shoulder pain    Time 6    Period Weeks    Status New    Target Date 07/15/20      PT LONG TERM GOAL #2   Title increase R shoulder abduction to >/= 140 degrees and IR to >/= L1 to promote functional ROM required for ADLS with </= 2/54 pain    Time 6    Period Weeks    Status New    Target Date 07/15/20      PT LONG TERM GOAL #3   Title maintain R shoulder strength to 5/5 to and is able to maintain proper scapulohumeral rhythm and is able to play ping pong per pt's goals with no  limitation.    Time 6    Period Weeks    Status New    Target Date 07/15/20      PT LONG TERM GOAL #4   Title increase FOTO score to >/= 70% function to demo improvement in function    Time 6    Period Weeks    Status New    Target Date 07/15/20      PT LONG TERM GOAL #5   Title pt to be I with all HEP and is able to maintain and progress current LOF IND    Time 6    Period Weeks    Status New    Target Date 07/15/20                 Plan - 06/13/20 1544    Clinical Impression Statement Pt reports no pain today but that while sitting at the desk at work it can flare up but is very positive about therapy and motivated to improve. Pt's PROM of the right shoulder was examined. Pt tolerated all exercises today well and needed minimal verbal and tactile cuing, especially for keeping shoulders down and back and keeping in pain-free range. Pt was educated on diaphragmatic breathing to reduce activation of accessory muscles of breathing.    Examination-Activity Limitations Reach Overhead    Stability/Clinical Decision Making Stable/Uncomplicated    Clinical Decision Making Low    Rehab Potential Good    PT Treatment/Interventions ADLs/Self Care Home Management;Cryotherapy;Electrical Stimulation;Iontophoresis 4mg /ml Dexamethasone;Moist Heat;Traction;Ultrasound;Gait training;Therapeutic activities;Therapeutic exercise;Balance training;Neuromuscular re-education;Manual techniques;Dry needling;Patient/family education;Passive range of motion;Taping    PT Next Visit Plan review FOTO assessment in detail, Advance HEP    PT Home Exercise Plan [id number] - upper trap stretch, levator scapulae stretching, shoulder scaption, ceiling punches, lower trap stretching with elbows propped on table.    Consulted and Agree with Plan of Care Patient           Patient will benefit from skilled therapeutic intervention in  order to improve the following deficits and impairments:  Pain, Impaired UE  functional use, Impaired flexibility  Visit Diagnosis: Chronic right shoulder pain  Other muscle spasm     Problem List Patient Active Problem List   Diagnosis Date Noted  . Impingement syndrome of right shoulder 04/05/2020  . Metatarsalgia of right foot 11/21/2017  . Patellar tendinitis of right knee 11/21/2017  . Pes anserine bursitis 08/23/2016  . Arthralgia of right acromioclavicular joint 08/23/2016  . Left Achilles tendinitis 01/18/2016  . Medial epicondylitis of right elbow 07/18/2015  . OSA on CPAP 03/03/2014  . Pain in joint, shoulder region 06/03/2013  . Left knee pain 05/19/2013  . Sleep apnea with use of continuous positive airway pressure (CPAP) 02/26/2013  . Obstructive apnea   . Perineum pain, male 10/02/2011  . HYPERLIPIDEMIA 01/27/2009  . PEPTIC ULCER DISEASE 01/27/2009  . BACK PAIN, LUMBAR 01/27/2009    Dawayne Cirri, SPT 06/13/2020, 5:12 PM   Carolyne Littles PT DPT  06/13/2020   During this treatment session, the therapist was present, participating in and directing the treatment.   [place], [place], [address] Phone: [phone]   Fax:  [phone]  Name: MORGON PAMER MRN: [id number] Date of Birth: [birth year]

## 2020-06-15 ENCOUNTER — Other Ambulatory Visit: Payer: Self-pay

## 2020-06-15 ENCOUNTER — Ambulatory Visit: Payer: Managed Care, Other (non HMO) | Admitting: Physical Therapy

## 2020-06-15 ENCOUNTER — Encounter: Payer: Self-pay | Admitting: Physical Therapy

## 2020-06-15 DIAGNOSIS — M62838 Other muscle spasm: Secondary | ICD-10-CM

## 2020-06-15 DIAGNOSIS — M25511 Pain in right shoulder: Secondary | ICD-10-CM

## 2020-06-15 DIAGNOSIS — G8929 Other chronic pain: Secondary | ICD-10-CM

## 2020-06-15 NOTE — Therapy (Addendum)
[place], [place], [address] Phone: [phone]   Fax:  [phone]  Physical Therapy Treatment  Patient Details  Name: Jacob Lozano MRN: [id number] Date of Birth: 1955-11-15 Referring Provider (PT): Stefanie Libel, MD   Encounter Date: 06/15/2020   PT End of Session - 06/15/20 1520    Visit Number 3    Number of Visits 13    Date for PT Re-Evaluation 07/15/20    Authorization Type [employer]: FOTO at 6th and 10th visit    PT Start Time 1503    PT Stop Time 1545    PT Time Calculation (min) 42 min    Activity Tolerance Patient tolerated treatment well    Behavior During Therapy [facility] for tasks assessed/performed           Past Medical History:  Diagnosis Date  . Cancer ([place]) 2012   basal cell ca on nose  . Hernia   . Hyperlipidemia   . Obstructive apnea    CPAP   . Ulcer     Past Surgical History:  Procedure Laterality Date  . APPENDECTOMY  2010  . HERNIA REPAIR  03/22/2011   Lap bilateral obturator & femoral hernia repairs    There were no vitals filed for this visit.   Subjective Assessment - 06/15/20 1513    Subjective Pt reports that after previous session he felt his shoulder was tired and sore, so he took yesterday off, but that he felt the level of the workout was good.    Limitations Lifting    Diagnostic tests [place] 05/17/2020 [place] BT intact and normalSubscap tendon normalInfraspinatus and Teres minor normalSupraspinatus normalDynamic motion - possibly some impingment under acromium    Patient Stated Goals being pain free, have full mobility, play ping pong without issues.    Currently in Pain? No/denies    Pain Score 0-No pain    Pain Onset More than a month ago    Aggravating Factors  Patient was reaching into dryer while bending and extremely aggravated his shoulder                             [facility] Adult PT Treatment/Exercise - 06/16/20 0001      Shoulder  Exercises: Standing   External Rotation AAROM;Strengthening;20 reps;Right    Theraband Level (Shoulder External Rotation) Level 2 (Red)    Internal Rotation AAROM;Right;20 reps    Theraband Level (Shoulder Internal Rotation) Level 2 (Red)    Flexion AROM;Right;20 reps   Wall balls unweighted blue ball CW/CCW 10 each direction   Extension AROM;10 reps   3 sets   Theraband Level (Shoulder Extension) Level 2 (Red)    Extension Weight (lbs) red    Row AROM;Strengthening;Both;12 reps   3 sets   Theraband Level (Shoulder Row) Level 2 (Red)      Manual Therapy   Joint Mobilization Grade II &III posterior and inferior; abduction grade I & II, LAD    Muscle Energy Technique STW to anterior deltoid, biceps, pecs, and subscapularis of right shoulder                  PT Education - 06/15/20 1556    Education Details Reviewed anatomy of shoulder with model and mechanism of impingement    Person(s) Educated Patient    Methods Explanation    Comprehension Verbalized understanding            PT  Short Term Goals - 06/03/20 1142      PT SHORT TERM GOAL #1   Title pt to be IND with inital HEP    Time 3    Period Weeks    Status New    Target Date 06/24/20             PT Long Term Goals - 06/03/20 1142      PT LONG TERM GOAL #1   Title pt to verbalize and demo efficient posture and lifting mechanics to reduce and prevent shoulder pain    Time 6    Period Weeks    Status New    Target Date 07/15/20      PT LONG TERM GOAL #2   Title increase R shoulder abduction to >/= 140 degrees and IR to >/= L1 to promote functional ROM required for ADLS with </= 9/51 pain    Time 6    Period Weeks    Status New    Target Date 07/15/20      PT LONG TERM GOAL #3   Title maintain R shoulder strength to 5/5 to and is able to maintain proper scapulohumeral rhythm and is able to play ping pong per pt's goals with no limitation.    Time 6    Period Weeks    Status New    Target Date  07/15/20      PT LONG TERM GOAL #4   Title increase FOTO score to >/= 70% function to demo improvement in function    Time 6    Period Weeks    Status New    Target Date 07/15/20      PT LONG TERM GOAL #5   Title pt to be I with all HEP and is able to maintain and progress current LOF IND    Time 6    Period Weeks    Status New    Target Date 07/15/20                 Plan - 06/15/20 1520    Clinical Impression Statement Pt was progressed with several exercises today, including wall balls at 90deg shoulder flexion all of which were well tolerated with minimal cuing. Pt reported positive response to manual therapy of tension within biceps tendon, anterior deltoid, and subscpularis that resulted in a feeling of increased range with visual confirmation from therapist. Pt continues to be highly motivated and is progressing well.    Examination-Activity Limitations Reach Overhead    Stability/Clinical Decision Making Stable/Uncomplicated    Rehab Potential Good    PT Treatment/Interventions ADLs/Self Care Home Management;Cryotherapy;Electrical Stimulation;Iontophoresis 4mg /ml Dexamethasone;Moist Heat;Traction;Ultrasound;Gait training;Therapeutic activities;Therapeutic exercise;Balance training;Neuromuscular re-education;Manual techniques;Dry needling;Patient/family education;Passive range of motion;Taping    PT Next Visit Plan review FOTO assessment in detail, Advance HEP and give handout    PT Home Exercise Plan [id number] - upper trap stretch, levator scapulae stretching, shoulder scaption, ceiling punches, lower trap stretching with elbows propped on table.    Consulted and Agree with Plan of Care Patient           Patient will benefit from skilled therapeutic intervention in order to improve the following deficits and impairments:  Pain, Impaired UE functional use, Impaired flexibility  Visit Diagnosis: Chronic right shoulder pain  Other muscle spasm     Problem  List Patient Active Problem List   Diagnosis Date Noted  . Impingement syndrome of right shoulder 04/05/2020  . Metatarsalgia of right foot 11/21/2017  . Patellar  tendinitis of right knee 11/21/2017  . Pes anserine bursitis 08/23/2016  . Arthralgia of right acromioclavicular joint 08/23/2016  . Left Achilles tendinitis 01/18/2016  . Medial epicondylitis of right elbow 07/18/2015  . OSA on CPAP 03/03/2014  . Pain in joint, shoulder region 06/03/2013  . Left knee pain 05/19/2013  . Sleep apnea with use of continuous positive airway pressure (CPAP) 02/26/2013  . Obstructive apnea   . Perineum pain, male 10/02/2011  . HYPERLIPIDEMIA 01/27/2009  . PEPTIC ULCER DISEASE 01/27/2009  . BACK PAIN, LUMBAR 01/27/2009    Carney Living, SPT 06/16/2020, 3:50 PM  [place], [place], [address] Phone: [phone]   Fax:  [phone]  Name: TREVINO WYATT MRN: [id number] Date of Birth: 10-29-1955

## 2020-06-16 ENCOUNTER — Encounter: Payer: Self-pay | Admitting: Physical Therapy

## 2020-06-20 ENCOUNTER — Encounter: Payer: Self-pay | Admitting: Family Medicine

## 2020-06-20 ENCOUNTER — Ambulatory Visit (INDEPENDENT_AMBULATORY_CARE_PROVIDER_SITE_OTHER): Payer: Managed Care, Other (non HMO) | Admitting: Family Medicine

## 2020-06-20 ENCOUNTER — Other Ambulatory Visit: Payer: Self-pay

## 2020-06-20 VITALS — BP 126/76 | HR 53 | Ht 73.0 in | Wt 192.0 lb

## 2020-06-20 DIAGNOSIS — G4733 Obstructive sleep apnea (adult) (pediatric): Secondary | ICD-10-CM

## 2020-06-20 DIAGNOSIS — Z9989 Dependence on other enabling machines and devices: Secondary | ICD-10-CM | POA: Diagnosis not present
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
## 2020-06-20 NOTE — Patient Instructions (Signed)
Please continue using your CPAP regularly. While your insurance requires that you use CPAP at least 4 hours each night on 70% of the nights, I recommend, that you not skip any nights and use it throughout the night if you can. Getting used to CPAP and staying with the treatment long term does take time and patience and discipline. Untreated obstructive sleep apnea when it is moderate to severe can have an adverse impact on cardiovascular health and raise her risk for heart disease, arrhythmias, hypertension, congestive heart failure, stroke and diabetes. Untreated obstructive sleep apnea causes sleep disruption, nonrestorative sleep, and sleep deprivation. This can have an impact on your day to day functioning and cause daytime sleepiness and impairment of cognitive function, memory loss, mood disturbance, and problems focussing. Using CPAP regularly can improve these symptoms.   Follow up in 1 year   Sleep Apnea Sleep apnea affects breathing during sleep. It causes breathing to stop for a short time or to become shallow. It can also increase the risk of:  Heart attack.  Stroke.  Being very overweight (obese).  Diabetes.  Heart failure.  Irregular heartbeat. The goal of treatment is to help you breathe normally again. What are the causes? There are three kinds of sleep apnea:  Obstructive sleep apnea. This is caused by a blocked or collapsed airway.  Central sleep apnea. This happens when the brain does not send the right signals to the muscles that control breathing.  Mixed sleep apnea. This is a combination of obstructive and central sleep apnea. The most common cause of this condition is a collapsed or blocked airway. This can happen if:  Your throat muscles are too relaxed.  Your tongue and tonsils are too large.  You are overweight.  Your airway is too small. What increases the risk?  Being overweight.  Smoking.  Having a small airway.  Being older.  Being  male.  Drinking alcohol.  Taking medicines to calm yourself (sedatives or tranquilizers).  Having family members with the condition. What are the signs or symptoms?  Trouble staying asleep.  Being sleepy or tired during the day.  Getting angry a lot.  Loud snoring.  Headaches in the morning.  Not being able to focus your mind (concentrate).  Forgetting things.  Less interest in sex.  Mood swings.  Personality changes.  Feelings of sadness (depression).  Waking up a lot during the night to pee (urinate).  Dry mouth.  Sore throat. How is this diagnosed?  Your medical history.  A physical exam.  A test that is done when you are sleeping (sleep study). The test is most often done in a sleep lab but may also be done at home. How is this treated?   Sleeping on your side.  Using a medicine to get rid of mucus in your nose (decongestant).  Avoiding the use of alcohol, medicines to help you relax, or certain pain medicines (narcotics).  Losing weight, if needed.  Changing your diet.  Not smoking.  Using a machine to open your airway while you sleep, such as: ? An oral appliance. This is a mouthpiece that shifts your lower jaw forward. ? A CPAP device. This device blows air through a mask when you breathe out (exhale). ? An EPAP device. This has valves that you put in each nostril. ? A BPAP device. This device blows air through a mask when you breathe in (inhale) and breathe out.  Having surgery if other treatments do not work. It is   important to get treatment for sleep apnea. Without treatment, it can lead to:  High blood pressure.  Coronary artery disease.  In men, not being able to have an erection (impotence).  Reduced thinking ability. Follow these instructions at home: Lifestyle  Make changes that your doctor recommends.  Eat a healthy diet.  Lose weight if needed.  Avoid alcohol, medicines to help you relax, and some pain  medicines.  Do not use any products that contain nicotine or tobacco, such as cigarettes, e-cigarettes, and chewing tobacco. If you need help quitting, ask your doctor. General instructions  Take over-the-counter and prescription medicines only as told by your doctor.  If you were given a machine to use while you sleep, use it only as told by your doctor.  If you are having surgery, make sure to tell your doctor you have sleep apnea. You may need to bring your device with you.  Keep all follow-up visits as told by your doctor. This is important. Contact a doctor if:  The machine that you were given to use during sleep bothers you or does not seem to be working.  You do not get better.  You get worse. Get help right away if:  Your chest hurts.  You have trouble breathing in enough air.  You have an uncomfortable feeling in your back, arms, or stomach.  You have trouble talking.  One side of your body feels weak.  A part of your face is hanging down. These symptoms may be an emergency. Do not wait to see if the symptoms will go away. Get medical help right away. Call your local emergency services (911 in the [place] Do not drive yourself to the hospital. Summary  This condition affects breathing during sleep.  The most common cause is a collapsed or blocked airway.  The goal of treatment is to help you breathe normally while you sleep. This information is not intended to replace advice given to you by your health care provider. Make sure you discuss any questions you have with your health care provider. Document Revised: 05/23/2018 Document Reviewed: 04/01/2018 Elsevier Patient Education  2020 Elsevier Inc.  

## 2020-06-21 ENCOUNTER — Encounter: Payer: Self-pay | Admitting: Physical Therapy

## 2020-06-21 ENCOUNTER — Other Ambulatory Visit: Payer: Self-pay

## 2020-06-21 ENCOUNTER — Ambulatory Visit: Payer: Managed Care, Other (non HMO) | Attending: Sports Medicine | Admitting: Physical Therapy

## 2020-06-21 DIAGNOSIS — M62838 Other muscle spasm: Secondary | ICD-10-CM | POA: Diagnosis present

## 2020-06-21 DIAGNOSIS — M25511 Pain in right shoulder: Secondary | ICD-10-CM | POA: Insufficient documentation

## 2020-06-21 DIAGNOSIS — G8929 Other chronic pain: Secondary | ICD-10-CM | POA: Insufficient documentation

## 2020-06-21 NOTE — Progress Notes (Signed)
Order for cpap supplies sent to [facility] via community msg. Confirmation received that the order transmitted was successful.  

## 2020-06-21 NOTE — Therapy (Signed)
[place], [place], [address] Phone: [phone]   Fax:  [phone]  Physical Therapy Treatment  Patient Details  Name: Jacob Lozano MRN: [id number] Date of Birth: 1956/04/06 Referring Provider (PT): Stefanie Libel, MD   Encounter Date: 06/21/2020   PT End of Session - 06/21/20 0903    Visit Number 4    Number of Visits 13    Date for PT Re-Evaluation 07/15/20    Authorization Type [employer]: FOTO at 6th and 10th visit    PT Start Time 0800    PT Stop Time 0844    PT Time Calculation (min) 44 min    Activity Tolerance Patient tolerated treatment well    Behavior During Therapy [facility] for tasks assessed/performed           Past Medical History:  Diagnosis Date  . Cancer ([place]) 2012   basal cell ca on nose  . Hernia   . Hyperlipidemia   . Obstructive apnea    CPAP   . Ulcer     Past Surgical History:  Procedure Laterality Date  . APPENDECTOMY  2010  . HERNIA REPAIR  03/22/2011   Lap bilateral obturator & femoral hernia repairs    There were no vitals filed for this visit.   Subjective Assessment - 06/21/20 0804    Subjective Patient is having a significant increase in pain. he can not think of why he is having more pain. He has increased pain when he abducts his armm    Limitations Lifting    Diagnostic tests [place] 05/17/2020 [place] BT intact and normalSubscap tendon normalInfraspinatus and Teres minor normalSupraspinatus normalDynamic motion - possibly some impingment under acromium    Patient Stated Goals being pain free, have full mobility, play ping pong without issues.    Currently in Pain? Yes    Pain Score 3     Pain Location Shoulder    Pain Orientation Right    Pain Descriptors / Indicators Aching    Pain Type Chronic pain    Pain Onset More than a month ago    Pain Frequency Intermittent    Aggravating Factors  Pain reaching and sleeping    Pain Relieving Factors getting out of position     Effect of Pain on Daily Activities limited reaching                             [facility] Adult PT Treatment/Exercise - 06/21/20 0001      Shoulder Exercises: Stretch   Other Shoulder Stretches upper trap/ levator scapulae 2 x 30 each       Manual Therapy   Manual therapy comments skilled palpation of trigger points    Joint Mobilization Grade II &III posterior and inferior; abduction grade I & II, LAD    Muscle Energy Technique STW to anterior deltoid, biceps, pecs, and subscapularis of right shoulder            Trigger Point Dry Needling - 06/21/20 0001    Consent Given? Yes    Education Handout Provided Yes    Muscles Treated Head and Neck Upper trapezius    Other Dry Needling 3 spots needled in upper trap using a 30x50 needle     Upper Trapezius Response Twitch reponse elicited;Palpable increased muscle length                PT Education - 06/21/20 7622  Education Details Benefits and risk of TPDN, upper trap and levator stretching    Person(s) Educated Patient    Methods Demonstration;Explanation;Tactile cues;Handout    Comprehension Verbalized understanding;Returned demonstration;Verbal cues required;Tactile cues required            PT Short Term Goals - 06/03/20 1142      PT SHORT TERM GOAL #1   Title pt to be IND with inital HEP    Time 3    Period Weeks    Status New    Target Date 06/24/20             PT Long Term Goals - 06/03/20 1142      PT LONG TERM GOAL #1   Title pt to verbalize and demo efficient posture and lifting mechanics to reduce and prevent shoulder pain    Time 6    Period Weeks    Status New    Target Date 07/15/20      PT LONG TERM GOAL #2   Title increase R shoulder abduction to >/= 140 degrees and IR to >/= L1 to promote functional ROM required for ADLS with </= 5/88 pain    Time 6    Period Weeks    Status New    Target Date 07/15/20      PT LONG TERM GOAL #3   Title maintain R shoulder strength  to 5/5 to and is able to maintain proper scapulohumeral rhythm and is able to play ping pong per pt's goals with no limitation.    Time 6    Period Weeks    Status New    Target Date 07/15/20      PT LONG TERM GOAL #4   Title increase FOTO score to >/= 70% function to demo improvement in function    Time 6    Period Weeks    Status New    Target Date 07/15/20      PT LONG TERM GOAL #5   Title pt to be I with all HEP and is able to maintain and progress current LOF IND    Time 6    Period Weeks    Status New    Target Date 07/15/20                 Plan - 06/21/20 3254    Clinical Impression Statement Patient had a large trigger point in his upper trap. He agreed to trigger point dry needling/. he had a great twtich response. He also had a great twtich respose in his anterior deltoid. Therapy performed PA glides and inferior glides. he repoirted a significant improvement in pain follwing treatment. He was given upper trap and levator stretching for home.    Examination-Activity Limitations Reach Overhead    PT Treatment/Interventions ADLs/Self Care Home Management;Cryotherapy;Electrical Stimulation;Iontophoresis 4mg /ml Dexamethasone;Moist Heat;Traction;Ultrasound;Gait training;Therapeutic activities;Therapeutic exercise;Balance training;Neuromuscular re-education;Manual techniques;Dry needling;Patient/family education;Passive range of motion;Taping    PT Next Visit Plan continue with manual therapy if still painful otherwise resume exercises    PT Home Exercise Plan [id number] - upper trap stretch, levator scapulae stretching, shoulder scaption, ceiling punches, lower trap stretching with elbows propped on table.    Consulted and Agree with Plan of Care Patient           Patient will benefit from skilled therapeutic intervention in order to improve the following deficits and impairments:  Pain, Impaired UE functional use, Impaired flexibility  Visit Diagnosis: Chronic right  shoulder pain  Other muscle spasm  Problem List Patient Active Problem List   Diagnosis Date Noted  . Impingement syndrome of right shoulder 04/05/2020  . Metatarsalgia of right foot 11/21/2017  . Patellar tendinitis of right knee 11/21/2017  . Pes anserine bursitis 08/23/2016  . Arthralgia of right acromioclavicular joint 08/23/2016  . Left Achilles tendinitis 01/18/2016  . Medial epicondylitis of right elbow 07/18/2015  . OSA on CPAP 03/03/2014  . Pain in joint, shoulder region 06/03/2013  . Left knee pain 05/19/2013  . Sleep apnea with use of continuous positive airway pressure (CPAP) 02/26/2013  . Obstructive apnea   . Perineum pain, male 10/02/2011  . HYPERLIPIDEMIA 01/27/2009  . PEPTIC ULCER DISEASE 01/27/2009  . BACK PAIN, LUMBAR 01/27/2009    Carney Living 06/21/2020, 9:31 AM  [facility] [address] [place], [place], [address] Phone: [phone]   Fax:  [phone]  Name: Jacob Lozano MRN: [id number] Date of Birth: June 08, 1956

## 2020-06-23 ENCOUNTER — Ambulatory Visit: Payer: Managed Care, Other (non HMO) | Admitting: Physical Therapy

## 2020-06-23 ENCOUNTER — Other Ambulatory Visit: Payer: Self-pay

## 2020-06-23 ENCOUNTER — Encounter: Payer: Self-pay | Admitting: Physical Therapy

## 2020-06-23 DIAGNOSIS — G8929 Other chronic pain: Secondary | ICD-10-CM

## 2020-06-23 DIAGNOSIS — M25511 Pain in right shoulder: Secondary | ICD-10-CM | POA: Diagnosis not present

## 2020-06-23 DIAGNOSIS — M62838 Other muscle spasm: Secondary | ICD-10-CM

## 2020-06-23 NOTE — Therapy (Addendum)
[place], [place], [address] Phone: [phone]   Fax:  [phone]  Physical Therapy Evaluation  Patient Details  Name: Jacob Lozano MRN: [id number] Date of Birth: 08-Apr-1956 Referring Provider (PT): Stefanie Libel, MD   Encounter Date: 06/23/2020   PT End of Session - 06/23/20 1626    Visit Number 5    Number of Visits 13    Date for PT Re-Evaluation 07/15/20    Authorization Type [employer]: FOTO at 6th and 10th visit    PT Start Time 1627    PT Stop Time 1712    PT Time Calculation (min) 45 min    Activity Tolerance Patient tolerated treatment well    Behavior During Therapy [facility] for tasks assessed/performed           Past Medical History:  Diagnosis Date   Cancer ([place]) 2012   basal cell ca on nose   Hernia    Hyperlipidemia    Obstructive apnea    CPAP    Ulcer     Past Surgical History:  Procedure Laterality Date   APPENDECTOMY  2010   HERNIA REPAIR  03/22/2011   Lap bilateral obturator & femoral hernia repairs    There were no vitals filed for this visit.    Subjective Assessment - 06/23/20 1622    Subjective "my shoulder is feeling much better today; had a rough day at work but was able to play ping pong with my left hand which was encouraging"    Limitations Lifting    Diagnostic tests [place] 05/17/2020 [place] BT intact and normalSubscap tendon normalInfraspinatus and Teres minor normalSupraspinatus normalDynamic motion - possibly some impingment under acromium    Patient Stated Goals being pain free, have full mobility, play ping pong without issues.    Currently in Pain? Yes    Pain Score 1     Pain Location Shoulder    Pain Orientation Right    Pain Descriptors / Indicators Aching    Pain Onset More than a month ago                          Objective measurements completed on examination: See above findings.       [facility] Adult PT Treatment/Exercise -  06/23/20 0001      Shoulder Exercises: Supine   Protraction Strengthening;Both;15 reps   3x15   Theraband Level (Shoulder Protraction) Other (comment)   Rhythmic stabilization   Protraction Weight (lbs) 4    Diagonals PROM;AROM;AAROM;Right;15 reps   D1 Flexion and Extension   Diagonals Weight (lbs) --   manual resistance   Diagonals Limitations pain in posterior deltoid that was resolved with pin and stretch and Scapulothoracic joint mobiliztaion with movement into abduction    Other Supine Exercises Pilates ring scapular Y bilateral 20      Shoulder Exercises: Standing   Other Standing Exercises Right supraspinatus "Y" lifts with 2# weight 1x20      Shoulder Exercises: Stretch   Other Shoulder Stretches upper trap/ levator scapulae 2 x 30 each       Manual Therapy   Manual therapy comments Scapular assist into flexion and abduction 1x10 sidelying; 1x10 while standing    Joint Mobilization Grade II &III posterior and inferior; abduction grade I & II, LAD    Muscle Energy Technique STW to infraspinatus, supraspinatus, posterior deltoid of right shoulder  PT Short Term Goals - 06/03/20 1142      PT SHORT TERM GOAL #1   Title pt to be IND with inital HEP    Time 3    Period Weeks    Status New    Target Date 06/24/20             PT Long Term Goals - 06/03/20 1142      PT LONG TERM GOAL #1   Title pt to verbalize and demo efficient posture and lifting mechanics to reduce and prevent shoulder pain    Time 6    Period Weeks    Status New    Target Date 07/15/20      PT LONG TERM GOAL #2   Title increase R shoulder abduction to >/= 140 degrees and IR to >/= L1 to promote functional ROM required for ADLS with </= 0/25 pain    Time 6    Period Weeks    Status New    Target Date 07/15/20      PT LONG TERM GOAL #3   Title maintain R shoulder strength to 5/5 to and is able to maintain proper scapulohumeral rhythm and is able to play ping pong per  pt's goals with no limitation.    Time 6    Period Weeks    Status New    Target Date 07/15/20      PT LONG TERM GOAL #4   Title increase FOTO score to >/= 70% function to demo improvement in function    Time 6    Period Weeks    Status New    Target Date 07/15/20      PT LONG TERM GOAL #5   Title pt to be I with all HEP and is able to maintain and progress current LOF IND    Time 6    Period Weeks    Status New    Target Date 07/15/20                  Plan - 06/23/20 1626    Clinical Impression Statement Pt. tolerated all exercises well with minimal cuing and no increase in pain, reporting "my shoulder feels looser" after treatment. Pt responded well to manual therapy, PNF resisted motion into D1 flexion and extension, and scapular mobilization into abduction. Pt demonstrated increased range of motion into abduction with supraspinatus full can exercises.    Examination-Activity Limitations Reach Overhead    PT Treatment/Interventions ADLs/Self Care Home Management;Cryotherapy;Electrical Stimulation;Iontophoresis 4mg /ml Dexamethasone;Moist Heat;Traction;Ultrasound;Gait training;Therapeutic activities;Therapeutic exercise;Balance training;Neuromuscular re-education;Manual techniques;Dry needling;Patient/family education;Passive range of motion;Taping    PT Next Visit Plan FOTO next visit, Progress supraspinatus exercises, consider I/T/Y, standing wall balls, assess DN for UT/RTmuscles, body blade    PT Home Exercise Plan [id number] - upper trap stretch, levator scapulae stretching, shoulder scaption, ceiling punches, lower trap stretching with elbows propped on table.    Consulted and Agree with Plan of Care Patient           Patient will benefit from skilled therapeutic intervention in order to improve the following deficits and impairments:  Pain, Impaired UE functional use, Impaired flexibility  Visit Diagnosis: Chronic right shoulder pain  Other muscle  spasm     Problem List Patient Active Problem List   Diagnosis Date Noted   Impingement syndrome of right shoulder 04/05/2020   Metatarsalgia of right foot 11/21/2017   Patellar tendinitis of right knee 11/21/2017   Pes anserine bursitis 08/23/2016   Arthralgia  of right acromioclavicular joint 08/23/2016   Left Achilles tendinitis 01/18/2016   Medial epicondylitis of right elbow 07/18/2015   OSA on CPAP 03/03/2014   Pain in joint, shoulder region 06/03/2013   Left knee pain 05/19/2013   Sleep apnea with use of continuous positive airway pressure (CPAP) 02/26/2013   Obstructive apnea    Perineum pain, male 10/02/2011   HYPERLIPIDEMIA 01/27/2009   PEPTIC ULCER DISEASE 01/27/2009   BACK PAIN, LUMBAR 01/27/2009    Dawayne Cirri, SPT 06/23/2020, 5:27 PM  [facility] [address] [place], [place], [address] Phone: [phone]   Fax:  [phone]  Name: TU BAYLE MRN: [id number] Date of Birth: Dec 20, 1955

## 2020-06-27 ENCOUNTER — Ambulatory Visit: Payer: Managed Care, Other (non HMO) | Admitting: Physical Therapy

## 2020-06-27 ENCOUNTER — Other Ambulatory Visit: Payer: Self-pay

## 2020-06-27 ENCOUNTER — Encounter: Payer: Self-pay | Admitting: Physical Therapy

## 2020-06-27 DIAGNOSIS — M25511 Pain in right shoulder: Secondary | ICD-10-CM | POA: Diagnosis not present

## 2020-06-27 DIAGNOSIS — M62838 Other muscle spasm: Secondary | ICD-10-CM

## 2020-06-27 DIAGNOSIS — G8929 Other chronic pain: Secondary | ICD-10-CM

## 2020-06-27 NOTE — Therapy (Addendum)
[place], [place], [address] Phone: [phone]   Fax:  [phone]  Physical Therapy Treatment  Patient Details  Name: Jacob Lozano MRN: [id number] Date of Birth: Feb 13, 1956 Referring Provider (PT): Stefanie Libel, MD   Encounter Date: 06/27/2020   PT End of Session - 06/27/20 1515    Visit Number 6    Number of Visits 13    Date for PT Re-Evaluation 07/15/20    Authorization Type [employer]: FOTO at 10th visit    PT Start Time 1500    PT Stop Time 1545    PT Time Calculation (min) 45 min    Activity Tolerance Patient tolerated treatment well    Behavior During Therapy [facility] for tasks assessed/performed           Past Medical History:  Diagnosis Date  . Cancer ([place]) 2012   basal cell ca on nose  . Hernia   . Hyperlipidemia   . Obstructive apnea    CPAP   . Ulcer     Past Surgical History:  Procedure Laterality Date  . APPENDECTOMY  2010  . HERNIA REPAIR  03/22/2011   Lap bilateral obturator & femoral hernia repairs    There were no vitals filed for this visit.   Subjective Assessment - 06/27/20 1502    Subjective Doing my exercises, occasionally reaching into the dryer I feel a pain in the front of my shoulder, but overall feeling better.    Limitations Lifting    Diagnostic tests [place] 05/17/2020 [place] BT intact and normalSubscap tendon normalInfraspinatus and Teres minor normalSupraspinatus normalDynamic motion - possibly some impingment under acromium    Patient Stated Goals being pain free, have full mobility, play ping pong without issues.    Currently in Pain? Yes    Pain Score 1     Pain Location Shoulder    Pain Orientation Right    Pain Descriptors / Indicators Aching    Pain Type Chronic pain    Pain Onset More than a month ago    Aggravating Factors  reaching to the side, reaching to the front while squatting, reaching for my wallet    Pain Relieving Factors out of position, calms  down quickly    Effect of Pain on Daily Activities limited reaching              [facility] PT Assessment - 06/27/20 0001      Observation/Other Assessments   Focus on Therapeutic Outcomes (FOTO)  51% function,   Reassessment 06/27/2020                        [facility] Adult PT Treatment/Exercise - 06/27/20 0001      Shoulder Exercises: Supine   Other Supine Exercises --      Shoulder Exercises: Seated   Elevation AROM;Strengthening;Both;20 reps;Theraband    Theraband Level (Shoulder Elevation) Level 3 (Green)      Shoulder Exercises: Standing   External Rotation Strengthening;20 reps;Right;Left;Theraband;AROM   2x20 against wall at comfy height   Theraband Level (Shoulder External Rotation) Level 2 (Red)    Internal Rotation Right;20 reps;AROM;AAROM    Theraband Level (Shoulder Internal Rotation) Level 2 (Red)    Flexion Strengthening;Right;Left;20 reps;AROM    Shoulder Flexion Weight (lbs) --   Pink foam roll flexion on wall to pain-free llimit   Extension AROM;20 reps;Theraband    Theraband Level (Shoulder Extension) Level 4 (Blue)    Diagonals AROM;Strengthening;Right;10 reps;Theraband  2x10   Theraband Level (Shoulder Diagonals) Level 1 (Yellow)    Diagonals Limitations --    Other Standing Exercises --    Other Standing Exercises Wall Balls CW/CCW 10 in each direction; 2x10      Shoulder Exercises: Body Blade   Flexion 45 seconds;3 reps    Flexion Limitations --   in pain free range   ABduction 30 seconds;2 reps   In pain-free range   External Rotation 15 seconds;1 rep    Internal Rotation 15 seconds;1 rep            Trigger Point Dry Needling - 06/27/20 0001    Consent Given? Yes    Education Handout Provided Yes    Muscles Treated Head and Neck Upper trapezius    Muscles Treated Upper Quadrant Subscapularis;Infraspinatus    Electrical Stimulation Performed with Dry Needling Yes    E-stim with Dry Needling Details 20 frequency for infrapsinatus and  UT; increasing intensity to tolerance                PT Education - 06/27/20 1512    Education Details Reviewed FOTO score, TPDN with estim, reviewed HEP    Person(s) Educated Patient    Methods Explanation    Comprehension Verbalized understanding            PT Short Term Goals - 06/03/20 1142      PT SHORT TERM GOAL #1   Title pt to be IND with inital HEP    Time 3    Period Weeks    Status New    Target Date 06/24/20             PT Long Term Goals - 06/03/20 1142      PT LONG TERM GOAL #1   Title pt to verbalize and demo efficient posture and lifting mechanics to reduce and prevent shoulder pain    Time 6    Period Weeks    Status New    Target Date 07/15/20      PT LONG TERM GOAL #2   Title increase R shoulder abduction to >/= 140 degrees and IR to >/= L1 to promote functional ROM required for ADLS with </= 7/78 pain    Time 6    Period Weeks    Status New    Target Date 07/15/20      PT LONG TERM GOAL #3   Title maintain R shoulder strength to 5/5 to and is able to maintain proper scapulohumeral rhythm and is able to play ping pong per pt's goals with no limitation.    Time 6    Period Weeks    Status New    Target Date 07/15/20      PT LONG TERM GOAL #4   Title increase FOTO score to >/= 70% function to demo improvement in function    Time 6    Period Weeks    Status New    Target Date 07/15/20      PT LONG TERM GOAL #5   Title pt to be I with all HEP and is able to maintain and progress current LOF IND    Time 6    Period Weeks    Status New    Target Date 07/15/20                 Plan - 06/27/20 1516    Clinical Impression Statement TPDN with estim was performed on the pt's right UT and  infraspinatus with pt consent (all trigger point therapy was done by licensed and certified therapist) at the start of treatment that was well tolerated. Pt was also needled in the right subscapularis without E-stim. Pt was then taken through  exercises focusing on strengthening muscles that were needled (internal/external rotators, abduction). Pt tolerated all treatment well and verbalized that they are completing HEP regularly.    Examination-Activity Limitations Reach Overhead    PT Treatment/Interventions ADLs/Self Care Home Management;Cryotherapy;Electrical Stimulation;Iontophoresis 4mg /ml Dexamethasone;Moist Heat;Traction;Ultrasound;Gait training;Therapeutic activities;Therapeutic exercise;Balance training;Neuromuscular re-education;Manual techniques;Dry needling;Patient/family education;Passive range of motion;Taping    PT Next Visit Plan Assess how TPDN went previous session, progress supraspinatus exercises, STW for shoulder PRN    PT Home Exercise Plan [id number] - upper trap stretch, levator scapulae stretching, shoulder scaption, ceiling punches, lower trap stretching with elbows on wall    Consulted and Agree with Plan of Care Patient           Patient will benefit from skilled therapeutic intervention in order to improve the following deficits and impairments:  Pain, Impaired UE functional use, Impaired flexibility  Visit Diagnosis: Chronic right shoulder pain  Other muscle spasm     Problem List Patient Active Problem List   Diagnosis Date Noted  . Impingement syndrome of right shoulder 04/05/2020  . Metatarsalgia of right foot 11/21/2017  . Patellar tendinitis of right knee 11/21/2017  . Pes anserine bursitis 08/23/2016  . Arthralgia of right acromioclavicular joint 08/23/2016  . Left Achilles tendinitis 01/18/2016  . Medial epicondylitis of right elbow 07/18/2015  . OSA on CPAP 03/03/2014  . Pain in joint, shoulder region 06/03/2013  . Left knee pain 05/19/2013  . Sleep apnea with use of continuous positive airway pressure (CPAP) 02/26/2013  . Obstructive apnea   . Perineum pain, male 10/02/2011  . HYPERLIPIDEMIA 01/27/2009  . PEPTIC ULCER DISEASE 01/27/2009  . BACK PAIN, LUMBAR 01/27/2009    Dawayne Cirri 06/27/2020, 6:03 PM  [facility] [address] [place], [place], [address] Phone: [phone]   Fax:  [phone]  Name: GIVANNI STARON MRN: [id number] Date of Birth: 05-11-1956

## 2020-06-28 ENCOUNTER — Ambulatory Visit: Payer: Managed Care, Other (non HMO) | Admitting: Sports Medicine

## 2020-06-30 ENCOUNTER — Ambulatory Visit: Payer: Managed Care, Other (non HMO) | Admitting: Physical Therapy

## 2020-06-30 ENCOUNTER — Other Ambulatory Visit: Payer: Self-pay

## 2020-06-30 ENCOUNTER — Encounter: Payer: Self-pay | Admitting: Physical Therapy

## 2020-06-30 DIAGNOSIS — M25511 Pain in right shoulder: Secondary | ICD-10-CM | POA: Diagnosis not present

## 2020-06-30 DIAGNOSIS — M62838 Other muscle spasm: Secondary | ICD-10-CM

## 2020-06-30 DIAGNOSIS — G8929 Other chronic pain: Secondary | ICD-10-CM

## 2020-06-30 NOTE — Therapy (Signed)
[place], [place], [address] Phone: [phone]   Fax:  [phone]  Physical Therapy Treatment  Patient Details  Name: Jacob Lozano MRN: [id number] Date of Birth: Feb 08, 1956 Referring Provider (PT): Stefanie Libel, MD   Encounter Date: 06/30/2020   PT End of Session - 06/30/20 1412    Visit Number 7    Number of Visits 13    Date for PT Re-Evaluation 07/15/20    Authorization Type [employer]: FOTO at 10th visit    PT Start Time 1330    PT Stop Time 1412    PT Time Calculation (min) 42 min    Activity Tolerance Patient tolerated treatment well    Behavior During Therapy [facility] for tasks assessed/performed           Past Medical History:  Diagnosis Date  . Cancer ([place]) 2012   basal cell ca on nose  . Hernia   . Hyperlipidemia   . Obstructive apnea    CPAP   . Ulcer     Past Surgical History:  Procedure Laterality Date  . APPENDECTOMY  2010  . HERNIA REPAIR  03/22/2011   Lap bilateral obturator & femoral hernia repairs    There were no vitals filed for this visit.   Subjective Assessment - 06/30/20 1336    Subjective Patient feels like it is getting better.    Diagnostic tests [place] 05/17/2020 [place] BT intact and normalSubscap tendon normalInfraspinatus and Teres minor normalSupraspinatus normalDynamic motion - possibly some impingment under acromium    Patient Stated Goals being pain free, have full mobility, play ping pong without issues.    Currently in Pain? Yes   catching in certain positions   Pain Score --   see above   Pain Location Shoulder    Pain Orientation Right    Pain Descriptors / Indicators Aching    Pain Type Chronic pain    Pain Frequency Intermittent    Aggravating Factors  reaching in certain positions    Pain Relieving Factors not hitting those positions    Effect of Pain on Daily Activities limited reaching                             [facility] Adult PT  Treatment/Exercise - 06/30/20 0001      Shoulder Exercises: Standing   External Rotation Strengthening;20 reps;Right;Left;Theraband;AROM   2x20 against wall at comfy height   Theraband Level (Shoulder External Rotation) Level 2 (Red)    Internal Rotation Right;20 reps;AROM;AAROM    Theraband Level (Shoulder Internal Rotation) Level 2 (Red)    Extension AROM;20 reps;Theraband    Theraband Level (Shoulder Extension) Level 4 (Blue)    Row 20 reps    Theraband Level (Shoulder Row) Level 4 (Blue)    Other Standing Exercises over head flexion with UE ranger x20       Manual Therapy   Manual therapy comments Scapular assist into flexion and abduction 1x10 sidelying; 1x10 while standing    Joint Mobilization Grade II &III posterior and inferior; abduction grade I & II, LAD    Muscle Energy Technique STW to infraspinatus, supraspinatus, posterior deltoid of right shoulder                  PT Education - 06/30/20 1402    Education Details reviewed use of gym equipment    Person(s) Educated Patient    Methods Demonstration;Tactile cues;Explanation;Verbal cues  Comprehension Verbal cues required;Verbalized understanding;Returned demonstration;Tactile cues required            PT Short Term Goals - 06/03/20 1142      PT SHORT TERM GOAL #1   Title pt to be IND with inital HEP    Time 3    Period Weeks    Status New    Target Date 06/24/20             PT Long Term Goals - 06/03/20 1142      PT LONG TERM GOAL #1   Title pt to verbalize and demo efficient posture and lifting mechanics to reduce and prevent shoulder pain    Time 6    Period Weeks    Status New    Target Date 07/15/20      PT LONG TERM GOAL #2   Title increase R shoulder abduction to >/= 140 degrees and IR to >/= L1 to promote functional ROM required for ADLS with </= 4/43 pain    Time 6    Period Weeks    Status New    Target Date 07/15/20      PT LONG TERM GOAL #3   Title maintain R shoulder  strength to 5/5 to and is able to maintain proper scapulohumeral rhythm and is able to play ping pong per pt's goals with no limitation.    Time 6    Period Weeks    Status New    Target Date 07/15/20      PT LONG TERM GOAL #4   Title increase FOTO score to >/= 70% function to demo improvement in function    Time 6    Period Weeks    Status New    Target Date 07/15/20      PT LONG TERM GOAL #5   Title pt to be I with all HEP and is able to maintain and progress current LOF IND    Time 6    Period Weeks    Status New    Target Date 07/15/20                 Plan - 06/30/20 1410    Clinical Impression Statement Patient continues to have a trigger point in his sub-scap but he reports it is improving. He tolerated ther-ex well. he did have one pinch with flexion stretching today. Therapy will continue with needling and porgressive strengthening.    Examination-Activity Limitations Reach Overhead    Stability/Clinical Decision Making Stable/Uncomplicated    Clinical Decision Making Low    Rehab Potential Good    PT Treatment/Interventions ADLs/Self Care Home Management;Cryotherapy;Electrical Stimulation;Iontophoresis 4mg /ml Dexamethasone;Moist Heat;Traction;Ultrasound;Gait training;Therapeutic activities;Therapeutic exercise;Balance training;Neuromuscular re-education;Manual techniques;Dry needling;Patient/family education;Passive range of motion;Taping    PT Next Visit Plan Assess how TPDN went previous session, progress supraspinatus exercises, STW for shoulder PRN    PT Home Exercise Plan [id number] - upper trap stretch, levator scapulae stretching, shoulder scaption, ceiling punches, lower trap stretching with elbows on wall    Consulted and Agree with Plan of Care Patient           Patient will benefit from skilled therapeutic intervention in order to improve the following deficits and impairments:  Pain, Impaired UE functional use, Impaired flexibility  Visit  Diagnosis: Chronic right shoulder pain  Other muscle spasm     Problem List Patient Active Problem List   Diagnosis Date Noted  . Impingement syndrome of right shoulder 04/05/2020  . Metatarsalgia of right foot  11/21/2017  . Patellar tendinitis of right knee 11/21/2017  . Pes anserine bursitis 08/23/2016  . Arthralgia of right acromioclavicular joint 08/23/2016  . Left Achilles tendinitis 01/18/2016  . Medial epicondylitis of right elbow 07/18/2015  . OSA on CPAP 03/03/2014  . Pain in joint, shoulder region 06/03/2013  . Left knee pain 05/19/2013  . Sleep apnea with use of continuous positive airway pressure (CPAP) 02/26/2013  . Obstructive apnea   . Perineum pain, male 10/02/2011  . HYPERLIPIDEMIA 01/27/2009  . PEPTIC ULCER DISEASE 01/27/2009  . BACK PAIN, LUMBAR 01/27/2009    Carney Living PT DPT  06/30/2020, 3:09 PM  [facility] [address] [place], [place], [address] Phone: [phone]   Fax:  [phone]  Name: QUINTAVIOUS RINCK MRN: [id number] Date of Birth: 08/28/1955

## 2020-07-05 ENCOUNTER — Other Ambulatory Visit: Payer: Self-pay

## 2020-07-05 ENCOUNTER — Ambulatory Visit: Payer: Managed Care, Other (non HMO) | Admitting: Physical Therapy

## 2020-07-05 ENCOUNTER — Encounter: Payer: Self-pay | Admitting: Physical Therapy

## 2020-07-05 DIAGNOSIS — M25511 Pain in right shoulder: Secondary | ICD-10-CM | POA: Diagnosis not present

## 2020-07-05 DIAGNOSIS — M62838 Other muscle spasm: Secondary | ICD-10-CM

## 2020-07-05 DIAGNOSIS — G8929 Other chronic pain: Secondary | ICD-10-CM

## 2020-07-05 NOTE — Therapy (Signed)
[place], [place], [address] Phone: [phone]   Fax:  [phone]  Physical Therapy Treatment  Patient Details  Name: Jacob Lozano MRN: [id number] Date of Birth: 1956-07-24 Referring Provider (PT): Stefanie Libel, MD   Encounter Date: 07/05/2020    PT End of Session - 07/05/20 0842    Visit Number 8    Number of Visits 13    Date for PT Re-Evaluation 07/15/20    Authorization Type [employer]: FOTO at 10th visit    PT Start Time 0800    PT Stop Time 0843    PT Time Calculation (min) 43 min    Activity Tolerance Patient tolerated treatment well    Behavior During Therapy [facility] for tasks assessed/performed           Past Medical History:  Diagnosis Date   Cancer ([place]) 2012   basal cell ca on nose   Hernia    Hyperlipidemia    Obstructive apnea    CPAP    Ulcer     Past Surgical History:  Procedure Laterality Date   APPENDECTOMY  2010   HERNIA REPAIR  03/22/2011   Lap bilateral obturator & femoral hernia repairs    There were no vitals filed for this visit.   Subjective Assessment - 07/05/20 0804    Subjective Patient reports it is improving but he still has a catch in his shoulder. He is sleeping better.    Limitations Lifting    Diagnostic tests [place] 05/17/2020 [place] BT intact and normalSubscap tendon normalInfraspinatus and Teres minor normalSupraspinatus normalDynamic motion - possibly some impingment under acromium    Patient Stated Goals being pain free, have full mobility, play ping pong without issues.    Currently in Pain? No/denies   only pain in certain positions                            [facility] Adult PT Treatment/Exercise - 07/05/20 0001      Shoulder Exercises: Standing   External Rotation Strengthening;20 reps;Right;Left;Theraband;AROM   2x20 against wall at comfy height   Theraband Level (Shoulder External Rotation) Level 2 (Red)    Internal Rotation  Right;20 reps;AROM;AAROM    Theraband Level (Shoulder Internal Rotation) Level 3 (Green)    Extension AROM;20 reps;Theraband    Theraband Level (Shoulder Extension) Level 4 (Blue)    Row 20 reps    Theraband Level (Shoulder Row) Level 4 (Blue)    Other Standing Exercises towel v wall x20 in low range flexion     Other Standing Exercises over head flexion with UE ranger x20       Manual Therapy   Manual therapy comments Scapular assist into flexion and abduction 1x10 sidelying; 1x10 while standing    Joint Mobilization Grade II &III posterior and inferior; abduction grade I & II, LAD    Muscle Energy Technique STW to infraspinatus, supraspinatus, posterior deltoid of right shoulder            Trigger Point Dry Needling - 07/05/20 0001    Consent Given? Yes    Education Handout Provided Yes    Muscles Treated Upper Quadrant Subscapularis;Deltoid    Other Dry Needling 2 spots to sub-scap in supoine 2xspots to anterior deltoids in supine all with .30x50 needle.     Subscapularis Response Twitch response elicited;Palpable increased muscle length    Deltoid Response Twitch response elicited;Palpable increased muscle length  PT Education - 07/05/20 0834    Education Details reviewed benefits of TPDN    Person(s) Educated Patient    Methods Explanation;Demonstration;Tactile cues;Verbal cues    Comprehension Returned demonstration;Verbal cues required;Verbalized understanding;Tactile cues required            PT Short Term Goals - 06/03/20 1142      PT SHORT TERM GOAL #1   Title pt to be IND with inital HEP    Time 3    Period Weeks    Status New    Target Date 06/24/20             PT Long Term Goals - 06/03/20 1142      PT LONG TERM GOAL #1   Title pt to verbalize and demo efficient posture and lifting mechanics to reduce and prevent shoulder pain    Time 6    Period Weeks    Status New    Target Date 07/15/20      PT LONG TERM GOAL #2   Title  increase R shoulder abduction to >/= 140 degrees and IR to >/= L1 to promote functional ROM required for ADLS with </= 7/74 pain    Time 6    Period Weeks    Status New    Target Date 07/15/20      PT LONG TERM GOAL #3   Title maintain R shoulder strength to 5/5 to and is able to maintain proper scapulohumeral rhythm and is able to play ping pong per pt's goals with no limitation.    Time 6    Period Weeks    Status New    Target Date 07/15/20      PT LONG TERM GOAL #4   Title increase FOTO score to >/= 70% function to demo improvement in function    Time 6    Period Weeks    Status New    Target Date 07/15/20      PT LONG TERM GOAL #5   Title pt to be I with all HEP and is able to maintain and progress current LOF IND    Time 6    Period Weeks    Status New    Target Date 07/15/20                 Plan - 07/05/20 0911    Clinical Impression Statement Patient had a great twtich respose in his sub-scap and anteriro deltoid. He continues to have pain with overhead flexion Therapy focused on strethening in a closed chain position with overhead strengthening. he had no significant increase in pain. He continues to have a trigger point in his sub-scap, upper trap, and anterior deltoid .    Stability/Clinical Decision Making Stable/Uncomplicated    Clinical Decision Making Low    Rehab Potential Good    PT Frequency 2x / week    PT Duration 6 weeks    PT Treatment/Interventions ADLs/Self Care Home Management;Cryotherapy;Electrical Stimulation;Iontophoresis 4mg /ml Dexamethasone;Moist Heat;Traction;Ultrasound;Gait training;Therapeutic activities;Therapeutic exercise;Balance training;Neuromuscular re-education;Manual techniques;Dry needling;Patient/family education;Passive range of motion;Taping    PT Next Visit Plan Assess how TPDN went previous session, progress supraspinatus exercises, STW for shoulder PRN    PT Home Exercise Plan [id number] - upper trap stretch, levator scapulae  stretching, shoulder scaption, ceiling punches, lower trap stretching with elbows on wall           Patient will benefit from skilled therapeutic intervention in order to improve the following deficits and impairments:  Pain, Impaired  UE functional use, Impaired flexibility  Visit Diagnosis: Chronic right shoulder pain  Other muscle spasm     Problem List Patient Active Problem List   Diagnosis Date Noted   Impingement syndrome of right shoulder 04/05/2020   Metatarsalgia of right foot 11/21/2017   Patellar tendinitis of right knee 11/21/2017   Pes anserine bursitis 08/23/2016   Arthralgia of right acromioclavicular joint 08/23/2016   Left Achilles tendinitis 01/18/2016   Medial epicondylitis of right elbow 07/18/2015   OSA on CPAP 03/03/2014   Pain in joint, shoulder region 06/03/2013   Left knee pain 05/19/2013   Sleep apnea with use of continuous positive airway pressure (CPAP) 02/26/2013   Obstructive apnea    Perineum pain, male 10/02/2011   HYPERLIPIDEMIA 01/27/2009   PEPTIC ULCER DISEASE 01/27/2009   BACK PAIN, LUMBAR 01/27/2009    Rachael Darby DPT  07/05/2020, 10:22 AM  [facility] [address] [place], [place], [address] Phone: [phone]   Fax:  [phone]  Name: Jacob Lozano MRN: [id number] Date of Birth: 10/05/1955

## 2020-07-07 ENCOUNTER — Other Ambulatory Visit: Payer: Self-pay

## 2020-07-07 ENCOUNTER — Ambulatory Visit: Payer: Managed Care, Other (non HMO) | Admitting: Physical Therapy

## 2020-07-07 ENCOUNTER — Encounter: Payer: Self-pay | Admitting: Physical Therapy

## 2020-07-07 DIAGNOSIS — G8929 Other chronic pain: Secondary | ICD-10-CM

## 2020-07-07 DIAGNOSIS — M25511 Pain in right shoulder: Secondary | ICD-10-CM

## 2020-07-07 DIAGNOSIS — M62838 Other muscle spasm: Secondary | ICD-10-CM

## 2020-07-07 NOTE — Therapy (Signed)
[place], [place], [address] Phone: [phone]   Fax:  [phone]  Physical Therapy Treatment  Patient Details  Name: Jacob Lozano MRN: [id number] Date of Birth: Oct 06, 1955 Referring Provider (PT): Stefanie Libel, MD   Encounter Date: 07/07/2020   PT End of Session - 07/07/20 0804    Visit Number 9    Number of Visits 13    Date for PT Re-Evaluation 07/15/20    Authorization Type [employer]: FOTO at 10th visit    PT Start Time 0800    PT Stop Time 0840    PT Time Calculation (min) 40 min    Activity Tolerance Patient tolerated treatment well    Behavior During Therapy [facility] for tasks assessed/performed           Past Medical History:  Diagnosis Date  . Cancer ([place]) 2012   basal cell ca on nose  . Hernia   . Hyperlipidemia   . Obstructive apnea    CPAP   . Ulcer     Past Surgical History:  Procedure Laterality Date  . APPENDECTOMY  2010  . HERNIA REPAIR  03/22/2011   Lap bilateral obturator & femoral hernia repairs    There were no vitals filed for this visit.   Subjective Assessment - 07/07/20 0802    Subjective Patient continues to feel a catch at times. He is avoiding yusing his right arm at times. He otherwise has no pain    Limitations Lifting    Diagnostic tests [place] 05/17/2020 [place] BT intact and normalSubscap tendon normalInfraspinatus and Teres minor normalSupraspinatus normalDynamic motion - possibly some impingment under acromium    Patient Stated Goals being pain free, have full mobility, play ping pong without issues.    Currently in Pain? No/denies   positional pain                            [facility] Adult PT Treatment/Exercise - 07/07/20 0001      Shoulder Exercises: Supine   Other Supine Exercises supine ABC 2lb; wand flexion 2lb in limited range 2x10       Shoulder Exercises: Standing   External Rotation Strengthening;20 reps;Right;Left;Theraband;AROM   2x20  against wall at comfy height   Theraband Level (Shoulder External Rotation) Level 2 (Red)    Internal Rotation Right;20 reps;AROM;AAROM    Theraband Level (Shoulder Internal Rotation) Level 3 (Green)    Other Standing Exercises towel v wall x20 in low range flexion     Other Standing Exercises over head flexion with UE ranger x20       Manual Therapy   Manual therapy comments Scapular assist into flexion and abduction 1x10 sidelying; 1x10 while standing    Joint Mobilization Grade II &III posterior and inferior; abduction grade I & II, LAD    Muscle Energy Technique STW to infraspinatus, supraspinatus, posterior deltoid of right shoulder                  PT Education - 07/07/20 0803    Education Details HEP and symptom mangement    Person(s) Educated Patient    Methods Explanation;Tactile cues;Verbal cues;Demonstration    Comprehension Verbalized understanding;Returned demonstration;Verbal cues required;Tactile cues required            PT Short Term Goals - 07/07/20 1022      PT SHORT TERM GOAL #1   Title pt to be IND with inital HEP  Time 3    Period Weeks    Status On-going    Target Date 06/24/20             PT Long Term Goals - 06/03/20 1142      PT LONG TERM GOAL #1   Title pt to verbalize and demo efficient posture and lifting mechanics to reduce and prevent shoulder pain    Time 6    Period Weeks    Status New    Target Date 07/15/20      PT LONG TERM GOAL #2   Title increase R shoulder abduction to >/= 140 degrees and IR to >/= L1 to promote functional ROM required for ADLS with </= 8/52 pain    Time 6    Period Weeks    Status New    Target Date 07/15/20      PT LONG TERM GOAL #3   Title maintain R shoulder strength to 5/5 to and is able to maintain proper scapulohumeral rhythm and is able to play ping pong per pt's goals with no limitation.    Time 6    Period Weeks    Status New    Target Date 07/15/20      PT LONG TERM GOAL #4    Title increase FOTO score to >/= 70% function to demo improvement in function    Time 6    Period Weeks    Status New    Target Date 07/15/20      PT LONG TERM GOAL #5   Title pt to be I with all HEP and is able to maintain and progress current LOF IND    Time 6    Period Weeks    Status New    Target Date 07/15/20                 Plan - 07/07/20 0806    Clinical Impression Statement Patient had focal pain around the bicpes today. He continues to report pain with overhead activity. We continue to advance his scpaular strengthening. exercises. He had a trigger oint in his deltoid again and on in his sub-scap;.    Examination-Activity Limitations Reach Overhead    Stability/Clinical Decision Making Stable/Uncomplicated    Clinical Decision Making Low    Rehab Potential Good    PT Frequency 2x / week    PT Treatment/Interventions ADLs/Self Care Home Management;Cryotherapy;Electrical Stimulation;Iontophoresis 4mg /ml Dexamethasone;Moist Heat;Traction;Ultrasound;Gait training;Therapeutic activities;Therapeutic exercise;Balance training;Neuromuscular re-education;Manual techniques;Dry needling;Patient/family education;Passive range of motion;Taping    PT Next Visit Plan Assess how TPDN went previous session, progress supraspinatus exercises, STW for shoulder PRN    PT Home Exercise Plan [email or web address] - upper trap stretch, levator scapulae stretching, shoulder scaption, ceiling punches, lower trap stretching with elbows on wall    Consulted and Agree with Plan of Care Patient           Patient will benefit from skilled therapeutic intervention in order to improve the following deficits and impairments:  Pain, Impaired UE functional use, Impaired flexibility  Visit Diagnosis: Chronic right shoulder pain  Other muscle spasm     Problem List Patient Active Problem List   Diagnosis Date Noted  . Impingement syndrome of right shoulder 04/05/2020  . Metatarsalgia of right foot  11/21/2017  . Patellar tendinitis of right knee 11/21/2017  . Pes anserine bursitis 08/23/2016  . Arthralgia of right acromioclavicular joint 08/23/2016  . Left Achilles tendinitis 01/18/2016  . Medial epicondylitis of right elbow 07/18/2015  . OSA  on CPAP 03/03/2014  . Pain in joint, shoulder region 06/03/2013  . Left knee pain 05/19/2013  . Sleep apnea with use of continuous positive airway pressure (CPAP) 02/26/2013  . Obstructive apnea   . Perineum pain, male 10/02/2011  . HYPERLIPIDEMIA 01/27/2009  . PEPTIC ULCER DISEASE 01/27/2009  . BACK PAIN, LUMBAR 01/27/2009    Carney Living PT DPT  07/07/2020, 1:11 PM  [facility] [address] [place], [place], [address] Phone: [phone]   Fax:  [phone]  Name: Jacob Lozano MRN: [id number] Date of Birth: 09/19/1955

## 2020-07-11 ENCOUNTER — Ambulatory Visit: Payer: Managed Care, Other (non HMO) | Admitting: Physical Therapy

## 2020-07-11 ENCOUNTER — Encounter: Payer: Self-pay | Admitting: Physical Therapy

## 2020-07-11 ENCOUNTER — Other Ambulatory Visit: Payer: Self-pay

## 2020-07-11 DIAGNOSIS — M62838 Other muscle spasm: Secondary | ICD-10-CM

## 2020-07-11 DIAGNOSIS — M25511 Pain in right shoulder: Secondary | ICD-10-CM | POA: Diagnosis not present

## 2020-07-11 DIAGNOSIS — G8929 Other chronic pain: Secondary | ICD-10-CM

## 2020-07-11 NOTE — Therapy (Addendum)
[place], [place], [address] Phone: [phone]   Fax:  [phone]  Physical Therapy Treatment  Patient Details  Name: Jacob Lozano MRN: [id number] Date of Birth: 11-Jun-1956 Referring Provider (PT): Stefanie Libel, MD   Encounter Date: 07/11/2020   PT End of Session - 07/11/20 0849    Visit Number 10    Number of Visits 13    Date for PT Re-Evaluation 07/15/20    Authorization Type [employer]    PT Start Time 0800    PT Stop Time 0845    PT Time Calculation (min) 45 min    Activity Tolerance Patient tolerated treatment well    Behavior During Therapy [facility] for tasks assessed/performed           Past Medical History:  Diagnosis Date  . Cancer ([place]) 2012   basal cell ca on nose  . Hernia   . Hyperlipidemia   . Obstructive apnea    CPAP   . Ulcer     Past Surgical History:  Procedure Laterality Date  . APPENDECTOMY  2010  . HERNIA REPAIR  03/22/2011   Lap bilateral obturator & femoral hernia repairs    There were no vitals filed for this visit.   Subjective Assessment - 07/11/20 0803    Subjective "I'm tired, it's getting better but still have that point where it hits. No chance of unloading the dryer."    Limitations Lifting    Diagnostic tests [place] 05/17/2020 [place] BT intact and normalSubscap tendon normalInfraspinatus and Teres minor normalSupraspinatus normalDynamic motion - possibly some impingment under acromium    Patient Stated Goals being pain free, have full mobility, play ping pong without issues.    Currently in Pain? No/denies   I can just feel it present   Pain Orientation Right    Aggravating Factors  Putting on a pullover              [facility] PT Assessment - 07/11/20 0001      Observation/Other Assessments   Focus on Therapeutic Outcomes (FOTO)  48% function (52% limited)   10th visit 07/11/2020                        [facility] Adult PT Treatment/Exercise -  07/11/20 0001      Shoulder Exercises: Sidelying   Other Sidelying Exercises 2x15 PT scapular assist into flexion with UE rangers       Shoulder Exercises: Standing   External Rotation Strengthening;20 reps;Right;Left;Theraband;AROM    Theraband Level (Shoulder External Rotation) Level 2 (Red)    External Rotation Limitations VC & TC to lower shoulders; slow and controlled    Internal Rotation Right;20 reps;AROM;AAROM    Theraband Level (Shoulder Internal Rotation) Level 3 (Green)    Internal Rotation Limitations VC & TC to lower shoulders; slow and controlled    Extension AROM;20 reps;Theraband    Theraband Level (Shoulder Extension) Level 4 (Blue)    Extension Limitations VC & TC to lower shoulders; slow and controlled    Other Standing Exercises towel v wall x20 in low range flexion     Other Standing Exercises wall balls with red weighted ball CW/CCW x20   started to feel fatigue by end     Modalities   Modalities Iontophoresis      Iontophoresis   Type of Iontophoresis Dexamethasone    Location Right shoulder, negative over acromion process with positive over lateral border of scapula  Dose 4mg /mL    Time 6hr patch      Manual Therapy   Manual therapy comments Scapular assist into flexion and abduction 1x10 sidelying;     Joint Mobilization Grade II &III posterior and inferior; abduction grade I & II, LAD    Muscle Energy Technique STW to right shoulder (biceps, pec mj&mn, deltoid, supraspinatus)                  PT Education - 07/11/20 0854    Education Details Reviewed FOTO, Anatomy of structural impingement and subacromial space; benefit of x-ray imaging of bony structures    Person(s) Educated Patient    Methods Explanation;Handout    Comprehension Verbalized understanding            PT Short Term Goals - 07/07/20 1022      PT SHORT TERM GOAL #1   Title pt to be IND with inital HEP    Time 3    Period Weeks    Status On-going    Target Date  06/24/20             PT Long Term Goals - 06/03/20 1142      PT LONG TERM GOAL #1   Title pt to verbalize and demo efficient posture and lifting mechanics to reduce and prevent shoulder pain    Time 6    Period Weeks    Status New    Target Date 07/15/20      PT LONG TERM GOAL #2   Title increase R shoulder abduction to >/= 140 degrees and IR to >/= L1 to promote functional ROM required for ADLS with </= 6/94 pain    Time 6    Period Weeks    Status New    Target Date 07/15/20      PT LONG TERM GOAL #3   Title maintain R shoulder strength to 5/5 to and is able to maintain proper scapulohumeral rhythm and is able to play ping pong per pt's goals with no limitation.    Time 6    Period Weeks    Status New    Target Date 07/15/20      PT LONG TERM GOAL #4   Title increase FOTO score to >/= 70% function to demo improvement in function    Time 6    Period Weeks    Status New    Target Date 07/15/20      PT LONG TERM GOAL #5   Title pt to be I with all HEP and is able to maintain and progress current LOF IND    Time 6    Period Weeks    Status New    Target Date 07/15/20                 Plan - 07/11/20 0850    Clinical Impression Statement Pt reports ability to carry out ADLs and vocational activities without consideration of limitation until he moves into right shoulder abduction/flexion and experiences 8-10s of intense pain that disappates quickly. Focus of therapy today was manual therapy for pain relief and range of motion and periscapular muscular strengthening which was well tolerated with moderate cuing for form.    Examination-Activity Limitations Reach Overhead    Examination-Participation Restrictions Cleaning    Stability/Clinical Decision Making Stable/Uncomplicated    Clinical Decision Making Low    Rehab Potential Good    PT Frequency 2x / week    PT Treatment/Interventions ADLs/Self Care Home Management;Cryotherapy;Dealer  Stimulation;Iontophoresis 4mg /ml Dexamethasone;Moist Heat;Traction;Ultrasound;Gait training;Therapeutic activities;Therapeutic exercise;Balance training;Neuromuscular re-education;Manual techniques;Dry needling;Patient/family education;Passive range of motion;Taping    PT Next Visit Plan Review Goals with pt., Progress exercises and STW PRN, schedule more visits?    PT Home Exercise Plan [email or web address] - upper trap stretch, levator scapulae stretching, shoulder scaption, ceiling punches, lower trap stretching with elbows on wall, Supine shoulder ABC    Recommended Other Services X-ray of right shoulder for suspected structural impingement    Consulted and Agree with Plan of Care Patient           Patient will benefit from skilled therapeutic intervention in order to improve the following deficits and impairments:  Pain, Impaired UE functional use, Impaired flexibility  Visit Diagnosis: Chronic right shoulder pain  Other muscle spasm     Problem List Patient Active Problem List   Diagnosis Date Noted  . Impingement syndrome of right shoulder 04/05/2020  . Metatarsalgia of right foot 11/21/2017  . Patellar tendinitis of right knee 11/21/2017  . Pes anserine bursitis 08/23/2016  . Arthralgia of right acromioclavicular joint 08/23/2016  . Left Achilles tendinitis 01/18/2016  . Medial epicondylitis of right elbow 07/18/2015  . OSA on CPAP 03/03/2014  . Pain in joint, shoulder region 06/03/2013  . Left knee pain 05/19/2013  . Sleep apnea with use of continuous positive airway pressure (CPAP) 02/26/2013  . Obstructive apnea   . Perineum pain, male 10/02/2011  . HYPERLIPIDEMIA 01/27/2009  . PEPTIC ULCER DISEASE 01/27/2009  . BACK PAIN, LUMBAR 01/27/2009    Dawayne Cirri, SPT 07/11/2020, 10:21 AM  [facility] [address] [place], [place], [address] Phone: [phone]   Fax:  [phone]  Name: Jacob Lozano MRN:  [id number] Date of Birth: [birth year]

## 2020-07-13 ENCOUNTER — Other Ambulatory Visit: Payer: Self-pay

## 2020-07-13 ENCOUNTER — Encounter: Payer: Self-pay | Admitting: Physical Therapy

## 2020-07-13 ENCOUNTER — Ambulatory Visit: Payer: Managed Care, Other (non HMO) | Admitting: Physical Therapy

## 2020-07-13 DIAGNOSIS — M25511 Pain in right shoulder: Secondary | ICD-10-CM

## 2020-07-13 DIAGNOSIS — G8929 Other chronic pain: Secondary | ICD-10-CM

## 2020-07-13 DIAGNOSIS — M62838 Other muscle spasm: Secondary | ICD-10-CM

## 2020-07-13 NOTE — Therapy (Addendum)
[place], [place], [address] Phone: [phone]   Fax:  [phone]  Physical Therapy Treatment / Re-Evaluation / Discharge  Patient Details  Name: Jacob Lozano MRN: [id number] Date of Birth: April 30, 1956 Referring Provider (PT): Stefanie Libel, MD   Encounter Date: 07/13/2020   PT End of Session - 07/13/20 1635    Visit Number 11    Number of Visits 18    Date for PT Re-Evaluation 08/24/20    Authorization Type [employer]    PT Start Time 1545    PT Stop Time 1630    PT Time Calculation (min) 45 min    Activity Tolerance Patient tolerated treatment well    Behavior During Therapy [facility] for tasks assessed/performed           Past Medical History:  Diagnosis Date  . Cancer ([place]) 2012   basal cell ca on nose  . Hernia   . Hyperlipidemia   . Obstructive apnea    CPAP   . Ulcer     Past Surgical History:  Procedure Laterality Date  . APPENDECTOMY  2010  . HERNIA REPAIR  03/22/2011   Lap bilateral obturator & femoral hernia repairs    There were no vitals filed for this visit.   Subjective Assessment - 07/13/20 1545    Subjective "shoulder is a little tired, I feel that one spot again in a noticeable way. I'm getting good at not flaring it up and using my left arm instead."    Limitations Lifting    Diagnostic tests [place] 05/17/2020 [place] BT intact and normalSubscap tendon normalInfraspinatus and Teres minor normalSupraspinatus normalDynamic motion - possibly some impingment under acromium    Patient Stated Goals being pain free, have full mobility, play ping pong without issues.    Currently in Pain? No/denies              [facility] PT Assessment - 07/13/20 0001      AROM   Right Shoulder Flexion 135 Degrees   pain present   Right Shoulder ABduction 99 Degrees   Pain still present   Right Shoulder Internal Rotation --   s1   Right Shoulder External Rotation --   C6     Strength   Right Shoulder  Flexion 4+/5   felt muscle but no pain   Right Shoulder ABduction 4+/5   Felt muscle but no pain                        [facility] Adult PT Treatment/Exercise - 07/13/20 0001      Modalities   Modalities Ultrasound      Ultrasound   Ultrasound Location right supraspinatus    Ultrasound Parameters continuous (100%), 1.0 w/cm2, 8.65mn, 1 mHz    Ultrasound Goals Other (Comment)   calcific tendinosis     Manual Therapy   Manual Therapy Soft tissue mobilization    Soft tissue mobilization STW to right supraspinatus                  PT Education - 07/13/20 1634    Education Details Reviewed and progressed HEP, POC, mechanism and benefits of ultrasound    Person(s) Educated Patient    Methods Explanation;Demonstration    Comprehension Verbalized understanding;Returned demonstration            PT Short Term Goals - 07/13/20 1550      PT SHORT TERM GOAL #1   Title  pt to be IND with inital HEP    Time 3    Period Weeks    Status Achieved    Target Date 06/24/20             PT Long Term Goals - 07/13/20 1550      PT LONG TERM GOAL #1   Title pt to verbalize and demo efficient posture and lifting mechanics to reduce and prevent shoulder pain    Time 6    Period Weeks    Status Partially Met      PT LONG TERM GOAL #2   Title increase R shoulder abduction to >/= 140 degrees and IR to >/= L1 to promote functional ROM required for ADLS with </= 6/96 pain    Time 6    Period Weeks    Status Partially Met      PT LONG TERM GOAL #3   Title maintain R shoulder strength to 5/5 to and is able to maintain proper scapulohumeral rhythm and is able to play ping pong per pt's goals with no limitation.    Time 6    Period Weeks    Status Partially Met      PT LONG TERM GOAL #4   Title increase FOTO score to >/= 70% function to demo improvement in function    Time 6    Period Weeks    Status On-going      PT LONG TERM GOAL #5   Title pt to be I with all  HEP and is able to maintain and progress current LOF IND    Time 6    Period Weeks    Status On-going                 Plan - 07/13/20 1636    Clinical Impression Statement Pt demonstrated increased right shoulder range of motion and decreased pain upon re-evaluation. [place], STW, & IASTM was performed to right supraspinatus that was well tolerated with pt reporting decreased pain and feeling much better post-treatment. pt is benefitting from therapy but wiill defer further visits until after 12/7 when they see the MD and can return for updated POC. Pt will benefit from continued therapy to address ROM, strength, and pain limitations to acheive the remainder of the pt's goals.    Examination-Activity Limitations Reach Overhead    Examination-Participation Restrictions Cleaning    Stability/Clinical Decision Making Stable/Uncomplicated    Rehab Potential Good    PT Frequency 2x / week    PT Duration 4 weeks    PT Treatment/Interventions ADLs/Self Care Home Management;Cryotherapy;Electrical Stimulation;Iontophoresis 81m/ml Dexamethasone;Moist Heat;Traction;Ultrasound;Gait training;Therapeutic activities;Therapeutic exercise;Balance training;Neuromuscular re-education;Manual techniques;Dry needling;Patient/family education;Passive range of motion;Taping    PT Next Visit Plan Discuss MD visit 12/7, [place] right supraspinatus?, progress right shoulder periscapular and rotator cuff strengthening    PT Home Exercise Plan [email or web address] - upper trap stretch, levator scapulae stretching, shoulder scaption, ceiling punches, lower trap stretching with elbows on wall, Supine shoulder ABC, banded ER/IR/Ext/Rows+Retraction    Consulted and Agree with Plan of Care Patient           Patient will benefit from skilled therapeutic intervention in order to improve the following deficits and impairments:  Pain, Impaired UE functional use, Impaired flexibility  Visit Diagnosis: Chronic right shoulder pain  Other  muscle spasm     Problem List Patient Active Problem List   Diagnosis Date Noted  . Impingement syndrome of right shoulder 04/05/2020  . Metatarsalgia of right foot  11/21/2017  . Patellar tendinitis of right knee 11/21/2017  . Pes anserine bursitis 08/23/2016  . Arthralgia of right acromioclavicular joint 08/23/2016  . Left Achilles tendinitis 01/18/2016  . Medial epicondylitis of right elbow 07/18/2015  . OSA on CPAP 03/03/2014  . Pain in joint, shoulder region 06/03/2013  . Left knee pain 05/19/2013  . Sleep apnea with use of continuous positive airway pressure (CPAP) 02/26/2013  . Obstructive apnea   . Perineum pain, male 10/02/2011  . HYPERLIPIDEMIA 01/27/2009  . PEPTIC ULCER DISEASE 01/27/2009  . BACK PAIN, LUMBAR 01/27/2009    Dawayne Cirri, SPT 07/13/2020, 5:10 PM  [facility] [address] [place], [place], [address] Phone: [phone]   Fax:  [phone]  Name: Jacob Lozano MRN: [id number] Date of Birth: May 18, 1956      PHYSICAL THERAPY DISCHARGE SUMMARY  Visits from Start of Care: 11  Current functional level related to goals / functional outcomes: See goals   Remaining deficits: Current status unknown   Education / Equipment: HEP, theraband, posture, lifting mechanics  Plan: Patient agrees to discharge.  Patient goals were partially met. Patient is being discharged due to not returning since the last visit.  ?????        Kristoffer Leamon PT, DPT, LAT, ATC  08/22/20  11:07 AM

## 2020-07-13 NOTE — Patient Instructions (Signed)
Access Code: [email or web address] URL: [email or web address] Date: 07/13/2020 Prepared by: [place].  Program Notes Perform stretches as needed. when performing resistance band exercises, focus on the slow, controlled return to start.   Exercises Seated Upper Trapezius Stretch - 2 x daily - 7 x weekly - 2 sets - 2 reps - 30 seconds hold Gentle Levator Scapulae Stretch - 2 x daily - 7 x weekly - 2 sets - 2 reps - 30 seconds hold Standing Bicep Stretch at [place] - 1 x daily - 7 x weekly - 2 sets - 2 reps - 30 hold Standing Shoulder Scaption - 1 x daily - 7 x weekly - 3 sets - 10 reps Supine Shoulder Alphabet - 1 x daily - 7 x weekly - 3 sets - 10 reps Standing Shoulder Row with Anchored Resistance - 1 x daily - 7 x weekly - 3 sets - 10 reps Shoulder Extension with Resistance - 1 x daily - 7 x weekly - 3 sets - 10 reps Shoulder External Rotation with Anchored Resistance - 1 x daily - 7 x weekly - 3 sets - 10 reps Shoulder Internal Rotation with Resistance - 1 x daily - 7 x weekly - 3 sets - 10 reps

## 2020-07-18 ENCOUNTER — Telehealth: Payer: Self-pay | Admitting: *Deleted

## 2020-07-18 DIAGNOSIS — G4733 Obstructive sleep apnea (adult) (pediatric): Secondary | ICD-10-CM

## 2020-07-18 NOTE — Telephone Encounter (Signed)
Order corrected and signed. Can you please print and send to DME? TY.

## 2020-07-18 NOTE — Telephone Encounter (Signed)
Message Received: Today Dinkins, Cyril Mourning, RN  Darleen Crocker, RN; Brandon Melnick, RN Can y'all look into this?       Previous Messages   ----- Message -----  From: Charmian Muff  Sent: 07/17/2020 11:56 AM EST  To: Cyril Mourning Dinkins, RN   I voided the request put in under Amy Lomax. However, the rx on file is missing pressures. We are also missing pt's PSG. We need these in order to finish processing this order. Can't put the order in GoScripts without pressures either.   ----- Message -----  From: Lester [place], RN  Sent: 07/13/2020  8:28 AM EST  To: Vanessa Ralphs, Charmian Muff   In goScripts can you change the attending physician to Dr. Brett Fairy for [facility] 1956-08-06?

## 2020-07-18 NOTE — Telephone Encounter (Signed)
CM sent to [facility] for cpap pressures order that was needed.

## 2020-07-19 ENCOUNTER — Ambulatory Visit: Payer: Managed Care, Other (non HMO) | Admitting: Sports Medicine

## 2020-07-20 NOTE — Telephone Encounter (Signed)
Jacquelyne Balint, RN Thank you!

## 2020-07-26 ENCOUNTER — Ambulatory Visit (INDEPENDENT_AMBULATORY_CARE_PROVIDER_SITE_OTHER): Payer: Managed Care, Other (non HMO) | Admitting: Sports Medicine

## 2020-07-26 ENCOUNTER — Other Ambulatory Visit: Payer: Self-pay

## 2020-07-26 VITALS — BP 126/82 | Ht 73.0 in | Wt 185.0 lb

## 2020-07-26 DIAGNOSIS — M7541 Impingement syndrome of right shoulder: Secondary | ICD-10-CM

## 2020-07-26 MED ORDER — METHYLPREDNISOLONE ACETATE 40 MG/ML IJ SUSP
40.0000 mg | Freq: Once | INTRAMUSCULAR | Status: AC
  Administered 2020-07-26: 40 mg via INTRA_ARTICULAR

## 2020-07-26 NOTE — Progress Notes (Signed)
SUBJECTIVE:   CHIEF COMPLAINT / HPI:   Right Shoulder Pain Follow Up Jacob Lozano is a very pleasant 64 year old male who returns for follow-up due to right shoulder pain.  He has been attending formal PT for the past 4 weeks and although he has made some improvement in his range of motion he is overall still having a lot of pain and cannot get full return motion back and states he cannot do the things he likes to do like reaching overhead to grab things or play ping-pong.  Otherwise, he does state that they have done some dry needling and some electrowave therapy which has helped some but he is hopeful that he can get more improvement.  PERTINENT  PMH / PSH: History of multiple areas of bursitis, AC joint arthritis, history of impingement of the right shoulder  OBJECTIVE:   BP 126/82   Ht 6\' 1"  (1.854 m)   Wt 185 lb (83.9 kg)   BMI 24.41 kg/m   [facility] 05/17/2020 07/26/2020  Frequency of aerobic exercise (# of days/week) 3 3  Average time in minutes 60 30  Frequency of strengthening activities (# of days/week) 0 1   Shoulder, Right: No evidence of bony deformity, asymmetry, or muscle atrophy; Slight tenderness over long head of biceps (bicipital groove). No TTP at [facility] joint. Limited active and passive range of motion mainly in Flexion (can get just to 90 degrees), extension, abduction (gets to about 105 degrees), and external rotation. Thumb to T12 with significant tenderness. Strength 5/5 throughout however right is weaker than the left when compared.  Special Tests:   - Crossarm test: NEG - Jobe test: NEG   - Hawkins: Positive   - Neer test: Positive   - Belly press test: NEG   - Drop arm test: NEG   - Obrien's test: NEG  Complete Ultrasound of Right Shoulder Biceps tendon: well visualized in both transverse and long axis with no effusion surrounding the tendon and no disruption of the tendon fibers. Pec major tendon: well visualized and inserting  at the greater head of the humerus Subscapularis: well visualized with no tears, no fiber disruption, no hypoechoic changes AC joint: well visualized, no giser sign present Infraspinatus: well visualized with no disruption of the fibers, smal area of hypoechoic change superior to the supraspinatus tendon Supraspinatus: well visualized with no tears or fiber disruption no hypoechoic changes Posterior glenohumeral joint: well visualized Interpretation: Overall, largely normal complete U/S of the right shoulder  Ultrasound and interpretation by Dr. Garlan Fillers and Jacob Lozano. Fields, MD    ASSESSMENT/PLAN:   Impingement syndrome of right shoulder Injection as noted below.  He continues to have impingement signs and symptoms however more concerning is his range of motion is still quite reduced and it is quite painful when hitting his upper limit of active motion.  Some concern for developing frozen shoulder although he does not meet this criteria yet we would not want this to happen.  Added exercises such as pendulums, wall crawl, and nonweighted ABCs arm weight only.  We will see him back in 4 weeks and hopefully his range of motion will continue to improve and the pain will be better after injection.   Written consent was obtained after discussing the risks and benefits of the procedure with the patient. A timeout was performed and the correct site and side was identified and confirmed.  The right was cleaned in sterile fashion betadine and alcohol pad. [place]  mg Depo-medrol and 6 cc 1% Lidocaine was injected using a posteriolateral approach using a 10 cc syringe and 25 gauge 1 and 1/2 in needle. No complications were encountered. Minimal blood loss. A band aid was applied.    Jacob Alpha, DO PGY-4, Sports Medicine Fellow [place]  I observed and examined the patient with the [facility] resident and agree with assessment and plan.  Note reviewed and modified by me. Jacob Mcgill, MD

## 2020-07-26 NOTE — Patient Instructions (Signed)
It was great to see you today! Thank you for letting me participate in your care!  Today, we discussed your continued right shoulder pain with improving but not yet fully recovered range of motion. Avoid activities and exercises that cause pain. Please do the exercises we showed you today and add those to what you were doing. Avoid using any weight exercises. The goal is to improve your range of motion over the next 4 weeks.  We will see you back in 4 weeks.  Be well, Harolyn Rutherford, DO PGY-4, Sports Medicine Fellow [place]

## 2020-07-26 NOTE — Assessment & Plan Note (Signed)
Injection as noted below.  He continues to have impingement signs and symptoms however more concerning is his range of motion is still quite reduced and it is quite painful when hitting his upper limit of active motion.  Some concern for developing frozen shoulder although he does not meet this criteria yet we would not want this to happen.  Added exercises such as pendulums, wall crawl, and nonweighted ABCs arm weight only.  We will see him back in 4 weeks and hopefully his range of motion will continue to improve and the pain will be better after injection.

## 2020-08-03 ENCOUNTER — Ambulatory Visit: Payer: Managed Care, Other (non HMO) | Admitting: Physical Therapy

## 2020-08-23 ENCOUNTER — Ambulatory Visit: Payer: Managed Care, Other (non HMO) | Admitting: Sports Medicine

## 2020-08-30 ENCOUNTER — Ambulatory Visit: Payer: Managed Care, Other (non HMO) | Admitting: Sports Medicine

## 2020-09-27 ENCOUNTER — Ambulatory Visit (INDEPENDENT_AMBULATORY_CARE_PROVIDER_SITE_OTHER): Payer: Managed Care, Other (non HMO) | Admitting: Sports Medicine

## 2020-09-27 ENCOUNTER — Other Ambulatory Visit: Payer: Self-pay

## 2020-09-27 VITALS — BP 130/82 | Ht 73.0 in | Wt 185.0 lb

## 2020-09-27 DIAGNOSIS — M7541 Impingement syndrome of right shoulder: Secondary | ICD-10-CM

## 2020-09-27 NOTE — Patient Instructions (Signed)
It was great to see you today! Thank you for letting me participate in your care!  Today, we discussed I am glad you are doing better. I think you have made significant improvement in your range of motion however it is not quite back to 100%. Please continue the range of motion exercises you are doing and you can add in some weighted exercises as long as it does not cause extreme pain. I would encourage you to play ping pong and any activity that allows for shoulder range of motion and movement without a lot of weight.  Be well, Harolyn Rutherford, DO PGY-4, Sports Medicine Fellow [place]

## 2020-09-27 NOTE — Progress Notes (Signed)
    SUBJECTIVE:   CHIEF COMPLAINT / HPI:   Right Adhesive Capsulitis Mr. Holtsclaw is here for follow up for right shoulder pain and adhesive capsulitis. He is doing well and has made progress with his ROM in flexion, external rotation, and abduction. He still has pain but only now when he is doing exercises and pushing the limits of his shoulder motion.  He tired formal PT at first but feels he has progressed more with HEP with [place] exercises and wants to continue those.  PERTINENT  PMH / PSH: Hx of right shoulder impingement  OBJECTIVE:   BP 130/82   Ht 6\' 1"  (1.854 m)   Wt 185 lb (83.9 kg)   BMI 24.41 kg/m   [facility] 05/17/2020 07/26/2020  Frequency of aerobic exercise (# of days/week) 3 3  Average time in minutes 60 30  Frequency of strengthening activities (# of days/week) 0 1   Shoulder, Right: No evidence of bony deformity, asymmetry, or muscle atrophy; Mild tenderness over long head of biceps (bicipital groove). No TTP at [facility] joint. Limited active and passive range of motion in flexion (140 deg), extension (less than 30 deg), abduction (110 deg), internal rotation and external rotation. Strength 5/5 throughout.Sensation intact. Peripheral pulses intact. 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  - Continue current range of motion exercises and keep pushing through moderate pain to achieve increased range of motion. - Avoid heavy strengthening exercises but can use weight 3lbs or less and added more exercises today - Cleared for all activity such as ping pong that will help with shoulder motion.  - Follow up in 6 weeks if no improvement, if better follow up in 3 months     Nuala Alpha, DO PGY-4,  Sports Medicine Fellow [place]  I observed and examined the patient with the [facility] resident and agree with assessment and plan.  Note reviewed and modified by me. Ila Mcgill, MD

## 2020-09-27 NOTE — Assessment & Plan Note (Signed)
Patient is now in the thawing phase of frozen shoulder most likely secondary from impingement syndrome.  - Continue current range of motion exercises and keep pushing through moderate pain to achieve increased range of motion. - Avoid heavy strengthening exercises but can use weight 3lbs or less and added more exercises today - Cleared for all activity such as ping pong that will help with shoulder motion.  - Follow up in 6 weeks if no improvement, if better follow up in 3 months

## 2021-01-05 DIAGNOSIS — F320 Major depressive disorder, single episode, mild: Secondary | ICD-10-CM | POA: Diagnosis not present

## 2021-01-24 DIAGNOSIS — B0052 Herpesviral keratitis: Secondary | ICD-10-CM | POA: Diagnosis not present

## 2021-01-31 DIAGNOSIS — F320 Major depressive disorder, single episode, mild: Secondary | ICD-10-CM | POA: Diagnosis not present

## 2021-02-16 DIAGNOSIS — D229 Melanocytic nevi, unspecified: Secondary | ICD-10-CM | POA: Diagnosis not present

## 2021-02-16 DIAGNOSIS — L821 Other seborrheic keratosis: Secondary | ICD-10-CM | POA: Diagnosis not present

## 2021-02-16 DIAGNOSIS — L570 Actinic keratosis: Secondary | ICD-10-CM | POA: Diagnosis not present

## 2021-02-16 DIAGNOSIS — L218 Other seborrheic dermatitis: Secondary | ICD-10-CM | POA: Diagnosis not present

## 2021-02-16 DIAGNOSIS — L814 Other melanin hyperpigmentation: Secondary | ICD-10-CM | POA: Diagnosis not present

## 2021-02-28 DIAGNOSIS — F320 Major depressive disorder, single episode, mild: Secondary | ICD-10-CM | POA: Diagnosis not present

## 2021-03-20 DIAGNOSIS — Z113 Encounter for screening for infections with a predominantly sexual mode of transmission: Secondary | ICD-10-CM | POA: Diagnosis not present

## 2021-03-20 DIAGNOSIS — H2513 Age-related nuclear cataract, bilateral: Secondary | ICD-10-CM | POA: Diagnosis not present

## 2021-03-20 DIAGNOSIS — H3581 Retinal edema: Secondary | ICD-10-CM | POA: Diagnosis not present

## 2021-03-20 DIAGNOSIS — Z111 Encounter for screening for respiratory tuberculosis: Secondary | ICD-10-CM | POA: Diagnosis not present

## 2021-03-20 DIAGNOSIS — H15002 Unspecified scleritis, left eye: Secondary | ICD-10-CM | POA: Diagnosis not present

## 2021-03-20 DIAGNOSIS — Z118 Encounter for screening for other infectious and parasitic diseases: Secondary | ICD-10-CM | POA: Diagnosis not present

## 2021-03-29 DIAGNOSIS — H18832 Recurrent erosion of cornea, left eye: Secondary | ICD-10-CM | POA: Diagnosis not present

## 2021-03-29 DIAGNOSIS — H5213 Myopia, bilateral: Secondary | ICD-10-CM | POA: Diagnosis not present

## 2021-03-29 DIAGNOSIS — H18831 Recurrent erosion of cornea, right eye: Secondary | ICD-10-CM | POA: Diagnosis not present

## 2021-03-29 DIAGNOSIS — H15002 Unspecified scleritis, left eye: Secondary | ICD-10-CM | POA: Diagnosis not present

## 2021-03-29 DIAGNOSIS — B0052 Herpesviral keratitis: Secondary | ICD-10-CM | POA: Diagnosis not present

## 2021-04-05 DIAGNOSIS — R5383 Other fatigue: Secondary | ICD-10-CM | POA: Diagnosis not present

## 2021-05-02 DIAGNOSIS — R5383 Other fatigue: Secondary | ICD-10-CM | POA: Diagnosis not present

## 2021-05-02 DIAGNOSIS — R7989 Other specified abnormal findings of blood chemistry: Secondary | ICD-10-CM | POA: Diagnosis not present

## 2021-05-02 DIAGNOSIS — I7090 Unspecified atherosclerosis: Secondary | ICD-10-CM | POA: Diagnosis not present

## 2021-05-02 DIAGNOSIS — E559 Vitamin D deficiency, unspecified: Secondary | ICD-10-CM | POA: Diagnosis not present

## 2021-05-09 DIAGNOSIS — H2513 Age-related nuclear cataract, bilateral: Secondary | ICD-10-CM | POA: Diagnosis not present

## 2021-05-09 DIAGNOSIS — Z79899 Other long term (current) drug therapy: Secondary | ICD-10-CM | POA: Diagnosis not present

## 2021-05-09 DIAGNOSIS — H15002 Unspecified scleritis, left eye: Secondary | ICD-10-CM | POA: Diagnosis not present

## 2021-05-10 DIAGNOSIS — Z79899 Other long term (current) drug therapy: Secondary | ICD-10-CM | POA: Diagnosis not present

## 2021-05-10 DIAGNOSIS — H15002 Unspecified scleritis, left eye: Secondary | ICD-10-CM | POA: Diagnosis not present

## 2021-05-15 DIAGNOSIS — R5383 Other fatigue: Secondary | ICD-10-CM | POA: Diagnosis not present

## 2021-05-19 DIAGNOSIS — B0052 Herpesviral keratitis: Secondary | ICD-10-CM | POA: Diagnosis not present

## 2021-05-25 DIAGNOSIS — H3581 Retinal edema: Secondary | ICD-10-CM | POA: Diagnosis not present

## 2021-05-25 DIAGNOSIS — H2513 Age-related nuclear cataract, bilateral: Secondary | ICD-10-CM | POA: Diagnosis not present

## 2021-05-25 DIAGNOSIS — H15002 Unspecified scleritis, left eye: Secondary | ICD-10-CM | POA: Diagnosis not present

## 2021-05-25 DIAGNOSIS — Z79899 Other long term (current) drug therapy: Secondary | ICD-10-CM | POA: Diagnosis not present

## 2021-06-12 DIAGNOSIS — G4733 Obstructive sleep apnea (adult) (pediatric): Secondary | ICD-10-CM | POA: Diagnosis not present

## 2021-06-26 ENCOUNTER — Ambulatory Visit: Payer: Managed Care, Other (non HMO) | Admitting: Family Medicine

## 2021-07-07 DIAGNOSIS — H2513 Age-related nuclear cataract, bilateral: Secondary | ICD-10-CM | POA: Diagnosis not present

## 2021-07-07 DIAGNOSIS — H15002 Unspecified scleritis, left eye: Secondary | ICD-10-CM | POA: Diagnosis not present

## 2021-07-07 DIAGNOSIS — Z79899 Other long term (current) drug therapy: Secondary | ICD-10-CM | POA: Diagnosis not present

## 2021-07-07 DIAGNOSIS — H3581 Retinal edema: Secondary | ICD-10-CM | POA: Diagnosis not present

## 2021-07-13 DIAGNOSIS — G4733 Obstructive sleep apnea (adult) (pediatric): Secondary | ICD-10-CM | POA: Diagnosis not present

## 2021-07-25 ENCOUNTER — Encounter: Payer: Self-pay | Admitting: Neurology

## 2021-07-25 ENCOUNTER — Ambulatory Visit (INDEPENDENT_AMBULATORY_CARE_PROVIDER_SITE_OTHER): Payer: BC Managed Care – PPO | Admitting: Neurology

## 2021-07-25 ENCOUNTER — Other Ambulatory Visit: Payer: Self-pay

## 2021-07-25 VITALS — BP 140/88 | HR 52 | Ht 73.0 in | Wt 189.5 lb

## 2021-07-25 DIAGNOSIS — G4730 Sleep apnea, unspecified: Secondary | ICD-10-CM | POA: Diagnosis not present

## 2021-07-25 NOTE — Progress Notes (Signed)
[facility]  Provider:  Dr Sanav Remer Referring Provider: Shon Baton, MD Primary Care Physician:  Shon Baton, MD  Chief Complaint  Patient presents with   Obstructive Sleep Apnea    Rm 10, alone. Here for initial CPAP f/u. Pt reports doing well with his new CPAP machine.     HPI:  Jacob Lozano is a 65 y.o. male here with a new CPAP machine replaced by [facility] without any orders.     07-25-2021: Rv on 07-25-2021; I had the pleasure of seeing Jacob Lozano today he had his last follow-up in November last year with Amy Lomax.  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 His last visit note does not state how old his machine is.  So we were not aware to order the much-needed home sleep test to keep track of his baseline.  He had residual apnea hypopnea indices that exceeded what was considered to be a good resolution of apnea.  He received a 3B machine from [facility].  An auto CPAP with a setting from a 4 to 12 cm water pressure.  Start date 06/25/2021.  He has used the machine consistently since it was just recently ordered.  And his average pressure need is 8.1 cmH2O at the 95th percentile.  He does not have any air leakage of significance.  His residual AHI is 1.6/h which is an ideal resolution.  He endorsed the Epworth Sleepiness Scale at only 3 points and the fatigue severity scale was not endorsed the geriatric depression scale 1/ 15.  He is in early retirement.      03-18-2018, RV with CPAP compliance. Recovered by now completely from his injury. 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  Looks great his compliance is is usually excellent, he has used the device 87% and each of these nights over 4 hours, he has also used his auto CPAP with a mean pressure of 7.7 cmH2O the average pressure at night is 9.4 and the  average time in large leak is 0 seconds (which is a very good result )residual AHI is around 4.0  also considering hypopneas  Minimal pressure is 5 cm , the maximum pressure is 12 cm.  There is an EPR of 1 cm water is using the humidifier at settings 3.   In January this year 2018 he had a gastrointestinal viral infect and was very sick. This is a six-month revisit after the first 30 days but in January 2014 the patient was placed on an auto-titrator CPAP for 30 days, after a HST confirmed OSA . Jacob Lozano is an ambidextrous, caucasian married gentleman, who was originally referred for loud snoring and sleep talking and his wife had witnessed apneas.  He may have trouble  falling asleep or staying asleep. He does not use caffeine unless a.m. he drinks about 6 beers weekly.  His  sleep time is between 10:30 and 6:30 AM, and he rarely has nocturia. And also has no jaw or neck surgery or surgical alteration of the upper airway. Patient was in January 2014 seen for a CPAP follow up- at the time he was 97% compliant -reported that he was looking forward to using the machine,  and felt more refreshed in the morning the auto- titration followed a home sleep  test, that revealed an AHI of 19 in  December 2013. The patient endorsed the Epworth Sleepiness Scale at 4 points and the fatigue severity scale at 18 points in his last visit 6 month ago.  The download shows 100% compliance and residual AHI of 4.3 and average user time of 7 hours and 14 minutes. This is fairly similar to the results of generally. The average device pressure is 8.4 cm the patient reports that he actually looks forward to wearing his CPAP as he is followed by a [facility] in [place] for his DME needs-  He is using a nasal comfort mask, not a nasal pillow.  [employer] downloads available today, 03-03-14, AHI residual at 3.8 and user time 6 hours and 34 minutes, 100% compliance for 30 days.  Patient has traits of OCD , ADHD - and focusses much  better since on CPAP. He has never used SSRIs.   03-09-15 Epworth sleepiness score endorsed at 5 points and fatigue severity at 19 points. Only during February of this year that the patient have a period of " noncompliance "but he suffered from a sinus and upper respiratory tract infection. Otherwise he has been a compliant user and he has seen the difference that the CPAP use mates in his daytime alertness and productivity. He has had 4 nights of insomnia a year, he dreams , he is refreshed in AM.  He takes antihistamines for seasonal rhinitis.  The download reveals an average use of the CPAP machine for 6 hours and 13 minutes per night. The average hypotony index is around 3.0 there are very few true apneas measured. The residual AHI for the night from 7:15 09/26/2014 for example at a complete AHI of 2.5 and an average also pressure of 5.8. At the 90th percentile pressure the patient uses 8 cm water. Since this is an auto VPAP the patient can continue with the current settings that needs to be no adjustments made. He  Will get new supplies from his DME, namely [facility] based in [place], [place].  Interval history from 03/05/2016. We are here for her yearly compliance review with Jacob Lozano his compliance has been excellent at 97% with an average user time of 7 hours and 4 minutes and a residual AHI of 3.7. He is using the same humidification settings he has a brief ramp time of 5 minutes and uses a minimum pressure of 4 and a maximum pressure of 12 cm water. Based on these results is no adjustment to be made. He enjoys his new home of the last year, still renovating.   Interval history from 03/07/2017, Jacob Lozano is here today for his routine compliance visit. He is a highly compliant CPAP user with 97% compliance over the last 30 days, average user time 7 hours and 4 minutes at night, his average AHI is 3.7 he does not have high air leaks, he is using an auto CPAP with a 90 percentile  pressure of 8.6 cm water. There is no evidence of central apneas emerging under treatment. He does not have major air leaks.     Review of Systems: Out of a complete 14 system review, the patient complains of only the following symptoms, and all other reviewed systems are negative. Epworth 3 , fatigue severity score ( FSS) 16.   Pain with bicycling , had a muscle tear since 2014 , not completely resolved.    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   Patient is working full-time.   Patient has a BSBA degree.   Patient is ambi-dextrous.   Patient drinks two cups of tea and 1/2 cup of soda and nothing after 1 pm.   Social Determinants of Health   Financial Resource Strain: Not on file  Food Insecurity: Not on file  Transportation Needs: Not on file  Physical Activity: Not on file  Stress: Not on file  Social Connections: Not on file  Intimate Partner Violence: Not on file    Family History  Problem Relation Age of Onset   Heart disease Father    Hypertension Father    Diabetes Neg Hx    Hyperlipidemia Neg Hx    Sudden death Neg Hx     Past Medical History:  Diagnosis Date   Cancer ([place]) 2012   basal cell ca on nose   Hernia    Hyperlipidemia    Obstructive apnea    CPAP    Ulcer     Past Surgical History:  Procedure Laterality Date   APPENDECTOMY  2010   HERNIA REPAIR  03/22/2011   Lap bilateral obturator & femoral hernia repairs     Current Outpatient Medications  Medication Sig Dispense Refill   azaTHIOprine (IMURAN) 50 MG tablet Take 1 tablet by mouth daily.     famciclovir (FAMVIR) 500 MG tablet Take 500 mg by mouth 3 (three) times daily.     No current facility-administered medications for this visit.    Allergies as of 07/25/2021   (No Known Allergies)    Vitals: BP 140/88   Pulse (!) 52   Ht 6\' 1"  (1.854 m)   Wt 189 lb 8 oz (86 kg)   BMI 25.00 kg/m  Last Weight:  Wt Readings from Last 1 Encounters:  07/25/21 189 lb 8 oz (86 kg)   Last Height:   Ht Readings from Last 1 Encounters:  07/25/21 6\' 1"  (1.854 m)   Vision Screening:  Left eye with correction .  Right eye with correction  20/30 .  Uses the same glasses for 7 years.   Physical exam:  General: The patient is awake, alert and appears not in acute distress. The patient is well groomed. Head: Normocephalic, atraumatic. Neck is supple. Mallampati 3 , neck circumference: 38 cm  Cardiovascular:  Regular rate and rhythm, without  murmurs or carotid bruit, and without distended neck veins. Respiratory: Lungs are clear to auscultation. Skin:  Without evidence of edema, or rash Trunk: BMI is normal.  Neurologic exam : Cranial nerves: Pupils are equal and briskly reactive to light. . Visual fields by finger perimetry are intact. Facial motor strength is symmetric and tongue and uvula move midline.  Motor exam:   Normal tone and normal muscle bulk and symmetric normal strength in all extremities. Assessment:  After physical and neurologic examination, review of laboratory studies, imaging, neurophysiology testing and pre-existing records,  New CPAP - and I am upset that we were not involved. I didn't even know about this replacement, and my name is listed as the responsible physician. WHAT IF THIS PATIENT doesn't have apnea anymore?   He does know he still snores if not on CPAP.  OSA on CPAP - 100% compliance, overall benefit for the  patient is high, he likes CPAP and sleeps better and deeper.   Plan:  Treatment plan - continued use of CPAP , unchanged in settings, from now on one yearly visit.   [employer], [facility].    Larey Seat, MD   Cc Dr. Virgina Jock

## 2021-07-27 DIAGNOSIS — H18831 Recurrent erosion of cornea, right eye: Secondary | ICD-10-CM | POA: Diagnosis not present

## 2021-08-12 DIAGNOSIS — G4733 Obstructive sleep apnea (adult) (pediatric): Secondary | ICD-10-CM | POA: Diagnosis not present

## 2021-08-29 DIAGNOSIS — E7210 Disorders of sulfur-bearing amino-acid metabolism, unspecified: Secondary | ICD-10-CM | POA: Diagnosis not present

## 2021-08-29 DIAGNOSIS — R7989 Other specified abnormal findings of blood chemistry: Secondary | ICD-10-CM | POA: Diagnosis not present

## 2021-08-29 DIAGNOSIS — E559 Vitamin D deficiency, unspecified: Secondary | ICD-10-CM | POA: Diagnosis not present

## 2021-08-29 DIAGNOSIS — I7090 Unspecified atherosclerosis: Secondary | ICD-10-CM | POA: Diagnosis not present

## 2021-09-04 DIAGNOSIS — R739 Hyperglycemia, unspecified: Secondary | ICD-10-CM | POA: Diagnosis not present

## 2021-09-04 DIAGNOSIS — E785 Hyperlipidemia, unspecified: Secondary | ICD-10-CM | POA: Diagnosis not present

## 2021-09-04 DIAGNOSIS — E559 Vitamin D deficiency, unspecified: Secondary | ICD-10-CM | POA: Diagnosis not present

## 2021-09-04 DIAGNOSIS — Z125 Encounter for screening for malignant neoplasm of prostate: Secondary | ICD-10-CM | POA: Diagnosis not present

## 2021-09-04 DIAGNOSIS — R5383 Other fatigue: Secondary | ICD-10-CM | POA: Diagnosis not present

## 2021-09-11 DIAGNOSIS — G4733 Obstructive sleep apnea (adult) (pediatric): Secondary | ICD-10-CM | POA: Diagnosis not present

## 2021-09-11 DIAGNOSIS — Z1212 Encounter for screening for malignant neoplasm of rectum: Secondary | ICD-10-CM | POA: Diagnosis not present

## 2021-09-11 DIAGNOSIS — E559 Vitamin D deficiency, unspecified: Secondary | ICD-10-CM | POA: Diagnosis not present

## 2021-09-11 DIAGNOSIS — Z1331 Encounter for screening for depression: Secondary | ICD-10-CM | POA: Diagnosis not present

## 2021-09-11 DIAGNOSIS — R739 Hyperglycemia, unspecified: Secondary | ICD-10-CM | POA: Diagnosis not present

## 2021-09-11 DIAGNOSIS — Z0000 Encounter for general adult medical examination without abnormal findings: Secondary | ICD-10-CM | POA: Diagnosis not present

## 2021-09-11 DIAGNOSIS — Z23 Encounter for immunization: Secondary | ICD-10-CM | POA: Diagnosis not present

## 2021-09-11 DIAGNOSIS — E785 Hyperlipidemia, unspecified: Secondary | ICD-10-CM | POA: Diagnosis not present

## 2021-09-12 DIAGNOSIS — G4733 Obstructive sleep apnea (adult) (pediatric): Secondary | ICD-10-CM | POA: Diagnosis not present

## 2021-09-26 DIAGNOSIS — H2513 Age-related nuclear cataract, bilateral: Secondary | ICD-10-CM | POA: Diagnosis not present

## 2021-09-26 DIAGNOSIS — H3581 Retinal edema: Secondary | ICD-10-CM | POA: Diagnosis not present

## 2021-09-26 DIAGNOSIS — H15002 Unspecified scleritis, left eye: Secondary | ICD-10-CM | POA: Diagnosis not present

## 2021-09-26 DIAGNOSIS — Z79899 Other long term (current) drug therapy: Secondary | ICD-10-CM | POA: Diagnosis not present

## 2021-09-26 DIAGNOSIS — H15003 Unspecified scleritis, bilateral: Secondary | ICD-10-CM | POA: Diagnosis not present

## 2021-10-13 DIAGNOSIS — G4733 Obstructive sleep apnea (adult) (pediatric): Secondary | ICD-10-CM | POA: Diagnosis not present

## 2021-10-26 DIAGNOSIS — R195 Other fecal abnormalities: Secondary | ICD-10-CM | POA: Diagnosis not present

## 2021-10-26 DIAGNOSIS — K219 Gastro-esophageal reflux disease without esophagitis: Secondary | ICD-10-CM | POA: Diagnosis not present

## 2021-11-01 DIAGNOSIS — H15003 Unspecified scleritis, bilateral: Secondary | ICD-10-CM | POA: Diagnosis not present

## 2021-11-10 DIAGNOSIS — G4733 Obstructive sleep apnea (adult) (pediatric): Secondary | ICD-10-CM | POA: Diagnosis not present

## 2021-11-13 DIAGNOSIS — E559 Vitamin D deficiency, unspecified: Secondary | ICD-10-CM | POA: Diagnosis not present

## 2021-11-13 DIAGNOSIS — E7210 Disorders of sulfur-bearing amino-acid metabolism, unspecified: Secondary | ICD-10-CM | POA: Diagnosis not present

## 2021-11-13 DIAGNOSIS — R7989 Other specified abnormal findings of blood chemistry: Secondary | ICD-10-CM | POA: Diagnosis not present

## 2021-11-13 DIAGNOSIS — I7090 Unspecified atherosclerosis: Secondary | ICD-10-CM | POA: Diagnosis not present

## 2021-11-15 DIAGNOSIS — H15003 Unspecified scleritis, bilateral: Secondary | ICD-10-CM | POA: Diagnosis not present

## 2021-11-30 DIAGNOSIS — R195 Other fecal abnormalities: Secondary | ICD-10-CM | POA: Diagnosis not present

## 2021-11-30 DIAGNOSIS — Z1211 Encounter for screening for malignant neoplasm of colon: Secondary | ICD-10-CM | POA: Diagnosis not present

## 2021-11-30 DIAGNOSIS — K5730 Diverticulosis of large intestine without perforation or abscess without bleeding: Secondary | ICD-10-CM | POA: Diagnosis not present

## 2021-11-30 DIAGNOSIS — K648 Other hemorrhoids: Secondary | ICD-10-CM | POA: Diagnosis not present

## 2021-12-06 ENCOUNTER — Emergency Department (HOSPITAL_COMMUNITY)
Admission: EM | Admit: 2021-12-06 | Discharge: 2021-12-06 | Disposition: A | Discharge status: Home / Self Care | Payer: BC Managed Care – PPO | Attending: Emergency Medicine | Admitting: Emergency Medicine

## 2021-12-06 ENCOUNTER — Other Ambulatory Visit: Payer: Self-pay

## 2021-12-06 ENCOUNTER — Encounter (HOSPITAL_COMMUNITY): Payer: Self-pay | Admitting: Emergency Medicine

## 2021-12-06 ENCOUNTER — Emergency Department (HOSPITAL_COMMUNITY): Payer: BC Managed Care – PPO

## 2021-12-06 DIAGNOSIS — R42 Dizziness and giddiness: Secondary | ICD-10-CM | POA: Insufficient documentation

## 2021-12-06 DIAGNOSIS — R202 Paresthesia of skin: Secondary | ICD-10-CM | POA: Insufficient documentation

## 2021-12-06 DIAGNOSIS — R200 Anesthesia of skin: Secondary | ICD-10-CM | POA: Diagnosis not present

## 2021-12-06 LAB — CBC
HCT: 46.8 % (ref 39.0–52.0)
Hemoglobin: 15.8 g/dL (ref 13.0–17.0)
MCH: 35.7 pg — ABNORMAL HIGH (ref 26.0–34.0)
MCHC: 33.8 g/dL (ref 30.0–36.0)
MCV: 105.6 fL — ABNORMAL HIGH (ref 80.0–100.0)
Platelets: 167 10*3/uL (ref 150–400)
RBC: 4.43 MIL/uL (ref 4.22–5.81)
RDW: 11.9 % (ref 11.5–15.5)
WBC: 4.1 10*3/uL (ref 4.0–10.5)
nRBC: 0 % (ref 0.0–0.2)

## 2021-12-06 LAB — BASIC METABOLIC PANEL
Anion gap: 6 (ref 5–15)
BUN: 18 mg/dL (ref 8–23)
CO2: 29 mmol/L (ref 22–32)
Calcium: 9.7 mg/dL (ref 8.9–10.3)
Chloride: 104 mmol/L (ref 98–111)
Creatinine, Ser: 0.98 mg/dL (ref 0.61–1.24)
GFR, Estimated: >60 mL/min (ref >60)
Glucose, Bld: 131 mg/dL — ABNORMAL HIGH (ref 70–99)
Potassium: 4.2 mmol/L (ref 3.5–5.1)
Sodium: 139 mmol/L (ref 135–145)

## 2021-12-06 LAB — URINALYSIS, ROUTINE W REFLEX MICROSCOPIC
Bilirubin Urine: NEGATIVE
Glucose, UA: NEGATIVE mg/dL
Hgb urine dipstick: NEGATIVE
Ketones, ur: NEGATIVE mg/dL
Leukocytes,Ua: NEGATIVE
Nitrite: NEGATIVE
Protein, ur: NEGATIVE mg/dL
Specific Gravity, Urine: 1.003 — ABNORMAL LOW (ref 1.005–1.030)
pH: 7 (ref 5.0–8.0)

## 2021-12-06 LAB — CBG MONITORING, ED: Glucose-Capillary: 121 mg/dL — ABNORMAL HIGH (ref 70–99)

## 2021-12-06 MED ORDER — MECLIZINE HCL 25 MG PO TABS: 25.0000 mg | ORAL_TABLET | Freq: Three times a day (TID) | ORAL | 0 refills | Status: DC | PRN

## 2021-12-06 MED ORDER — LACTATED RINGERS IV BOLUS
1000.0000 mL | Freq: Once | INTRAVENOUS | Status: AC
Start: 2021-12-06 — End: 2021-12-06
  Administered 2021-12-06: 1000 mL via INTRAVENOUS

## 2021-12-06 MED ORDER — MECLIZINE HCL 25 MG PO TABS
25.0000 mg | ORAL_TABLET | Freq: Once | ORAL | Status: AC
  Administered 2021-12-06: 25 mg via ORAL
  Filled 2021-12-06: qty 1

## 2021-12-06 NOTE — ED Notes (Signed)
Patient transported to MRI 

## 2021-12-06 NOTE — ED Triage Notes (Signed)
Pt reports dizziness, numbness in the right arm and leg. Weakness in right leg that began this morning.   ?

## 2021-12-06 NOTE — ED Provider Notes (Signed)
?[facility] DEPT ?Provider Note ? ? ?CSN: [id number] ?Arrival date & time: 12/06/21  1016 ? ?  ? ?History ? ?Chief Complaint  ?Patient presents with  ? Dizziness  ? Right arm tingling   ? ? ?Jacob Lozano is a 66 y.o. male.  Presenting to the emergency room with concern for dizziness as well as tingling sensation in his right arm and leg.  Patient states that yesterday he noted slight tingling sensation in his right arm/hand distal to his elbow.  Seem to come and go throughout the day.  Also went on a long bike ride yesterday.  Had no complaints of chest pain or shortness of breath while on his bike ride.  This morning he noted feeling dizzy.  States that it is worse with positions, improved with rest.  At times has room spinning sensation.  Currently is described as mild.  A little while later in the morning he started having the right arm tingling and right leg tingling.  States that he has not lost any sort of sensation in the arm or leg and he has not had any weakness in his arm or leg.  No speech or vision change.  No vomiting.  Has been able to walk. ? ?HPI ? ?  ? ?Home Medications ?Prior to Admission medications   ?Medication Sig Start Date End Date Taking? Authorizing Provider  ?meclizine (ANTIVERT) 25 MG tablet Take 1 tablet (25 mg total) by mouth 3 (three) times daily as needed for dizziness. 12/06/21  Yes Lucrezia Starch, MD  ?azaTHIOprine (IMURAN) 50 MG tablet Take 1 tablet by mouth daily. 07/12/21   [provider]  ?famciclovir (FAMVIR) 500 MG tablet Take 500 mg by mouth 3 (three) times daily. 07/10/21   [provider]  ?   ? ?Allergies    ?Patient has no known allergies.   ? ?Review of Systems   ?Review of Systems  ?Constitutional:  Negative for chills and fever.  ?HENT:  Negative for ear pain and sore throat.   ?Eyes:  Negative for pain and visual disturbance.  ?Respiratory:  Negative for cough and shortness of breath.   ?Cardiovascular:  Negative  for chest pain and palpitations.  ?Gastrointestinal:  Negative for abdominal pain and vomiting.  ?Genitourinary:  Negative for dysuria and hematuria.  ?Musculoskeletal:  Negative for arthralgias and back pain.  ?Skin:  Negative for color change and rash.  ?Neurological:  Positive for dizziness. Negative for seizures and syncope.  ?All other systems reviewed and are negative. ? ?Physical Exam ?Updated Vital Signs ?BP (!) 143/88   Pulse (!) 50   Temp 97.8 ?F (36.6 ?C) (Oral)   Resp 20   Ht '6\' 2"'$  (1.88 m)   Wt 81.6 kg   SpO2 99%   BMI 23.11 kg/m?  ?Physical Exam ?Vitals and nursing note reviewed.  ?Constitutional:   ?   General: He is not in acute distress. ?   Appearance: He is well-developed.  ?HENT:  ?   Head: Normocephalic and atraumatic.  ?   Right Ear: Tympanic membrane, ear canal and external ear normal.  ?   Left Ear: Tympanic membrane, ear canal and external ear normal.  ?Eyes:  ?   Conjunctiva/sclera: Conjunctivae normal.  ?Cardiovascular:  ?   Rate and Rhythm: Normal rate and regular rhythm.  ?   Heart sounds: No murmur heard. ?Pulmonary:  ?   Effort: Pulmonary effort is normal. No respiratory distress.  ?   Breath sounds:  Normal breath sounds.  ?Abdominal:  ?   Palpations: Abdomen is soft.  ?   Tenderness: There is no abdominal tenderness.  ?Musculoskeletal:     ?   General: No swelling.  ?   Cervical back: Neck supple.  ?Skin: ?   General: Skin is warm and dry.  ?   Capillary Refill: Capillary refill takes less than 2 seconds.  ?Neurological:  ?   Mental Status: He is alert.  ?   Comments: AAOx3 ?CN 2-12 intact, speech clear visual fields intact ?5/5 strength in b/l UE and LE ?Sensation to light touch intact in b/l UE and LE ?Normal FNF ?Normal gait  ?Psychiatric:     ?   Mood and Affect: Mood normal.  ? ? ?ED Results / Procedures / Treatments   ?Labs ?(all labs ordered are listed, but only abnormal results are displayed) ?Labs Reviewed  ?BASIC METABOLIC PANEL - Abnormal; Notable for the following  components:  ?    Result Value  ? Glucose, Bld 131 (*)   ? All other components within normal limits  ?CBC - Abnormal; Notable for the following components:  ? MCV 105.6 (*)   ? MCH 35.7 (*)   ? All other components within normal limits  ?URINALYSIS, ROUTINE W REFLEX MICROSCOPIC - Abnormal; Notable for the following components:  ? Color, Urine STRAW (*)   ? Specific Gravity, Urine 1.003 (*)   ? All other components within normal limits  ?CBG MONITORING, ED - Abnormal; Notable for the following components:  ? Glucose-Capillary 121 (*)   ? All other components within normal limits  ? ? ?EKG ?EKG Interpretation ? ?Date/Time:  Wednesday December 06 2021 10:24:04 EDT ?Ventricular Rate:  51 ?PR Interval:  183 ?QRS Duration: 96 ?QT Interval:  421 ?QTC Calculation: 388 ?R Axis:   73 ?Text Interpretation: Sinus rhythm Left ventricular hypertrophy Confirmed by Madalyn Rob [id number]) on 12/06/2021 12:53:11 PM ? ?Radiology ?MR BRAIN WO CONTRAST ? ?Result Date: 12/06/2021 ?CLINICAL DATA:  Dizziness, right arm numbness, word-finding difficulty EXAM: MRI HEAD WITHOUT CONTRAST TECHNIQUE: Multiplanar, multiecho pulse sequences of the brain and surrounding structures were obtained without intravenous contrast. COMPARISON:  None. FINDINGS: Brain: There is no acute intracranial hemorrhage, extra-axial fluid collection, or acute infarct. Parenchymal volume is normal. The ventricles are normal in size. Scattered small foci of FLAIR signal abnormality in the subcortical and periventricular white matter likely reflects sequela of mild chronic white matter microangiopathy. There is a small probable remote lacunar infarct in the left thalamus. There is no suspicious parenchymal signal abnormality. There is no mass lesion. There is no mass effect or midline shift. Vascular: Normal flow voids. Skull and upper cervical spine: Normal marrow signal. Sinuses/Orbits: There is mild mucosal thickening in the paranasal sinuses. The globes and orbits are  unremarkable. Other: None. IMPRESSION: No acute intracranial pathology. Electronically Signed   By: Valetta Mole M.D.   On: 12/06/2021 14:24   ? ?Procedures ?Procedures  ? ? ?Medications Ordered in ED ?Medications  ?meclizine (ANTIVERT) tablet 25 mg (25 mg Oral Given 12/06/21 1133)  ?lactated ringers bolus 1,000 mL (0 mLs Intravenous Stopped 12/06/21 1321)  ? ? ?ED Course/ Medical Decision Making/ A&P ?  ?                        ?Medical Decision Making ?Amount and/or Complexity of Data Reviewed ?Labs: ordered. ?Radiology: ordered. ? ? ?65 year old gentleman presenting to the emergency room with concern for dizziness  with associated right arm and leg tingling sensation.  On physical exam he appeared well in no distress.  He has a normal physical exam including a normal neurologic exam.  Furthermore on sensory testing, on careful sensory exam, his sensation to both light touch and pinprick was intact throughout the entirety of both of his extremities and there was no difference in his sensation in any of his extremities.  No focal weakness.  Therefore I have a lower suspicion for acute stroke at this time.  Nevertheless given the reported symptomatology, feel would be prudent to check MRI of his brain to rule out central etiology for the vertigo/dizziness.  MRI is negative.  On reassessment after patient received some fluids and meclizine the dizziness had improved.  Patient remained well-appearing with grossly stable vital signs.  Noted mild bradycardia but normal blood pressure, notably patient describes very active physical exercise.  Suspect this is his baseline.  EKG demonstrated sinus rhythm.  His basic labs are stable, no anemia or electrolyte derangement.  Given work-up today, improving symptoms, feel patient can be discharged home to manage in the outpatient setting.  Given this reported tingling sensation associated with dizziness, feel would be beneficial for patient to follow-up with a neurologist.  Will  place ambulatory referral for outpatient neurology follow-up.  Given the fact that patient has no focal neurologic deficit and his sensory exam to light touch and pinprick was intact, feel this follow-up with n

## 2021-12-06 NOTE — Discharge Instructions (Signed)
Recommend following up with your primary care doctor as well as a neurology specialist.  If you feel that your numbness is worsening or you develop any sort of weakness, speech change, vision change, worsening dizziness, or other new concerning symptom, come back to ER for reassessment. ?

## 2021-12-08 DIAGNOSIS — H15003 Unspecified scleritis, bilateral: Secondary | ICD-10-CM | POA: Diagnosis not present

## 2021-12-11 DIAGNOSIS — G4733 Obstructive sleep apnea (adult) (pediatric): Secondary | ICD-10-CM | POA: Diagnosis not present

## 2022-01-01 ENCOUNTER — Institutional Professional Consult (permissible substitution): Payer: BC Managed Care – PPO | Admitting: Neurology

## 2022-01-02 DIAGNOSIS — H15003 Unspecified scleritis, bilateral: Secondary | ICD-10-CM | POA: Diagnosis not present

## 2022-01-10 DIAGNOSIS — G4733 Obstructive sleep apnea (adult) (pediatric): Secondary | ICD-10-CM | POA: Diagnosis not present

## 2022-02-10 DIAGNOSIS — G4733 Obstructive sleep apnea (adult) (pediatric): Secondary | ICD-10-CM | POA: Diagnosis not present

## 2022-02-26 DIAGNOSIS — L821 Other seborrheic keratosis: Secondary | ICD-10-CM | POA: Diagnosis not present

## 2022-02-26 DIAGNOSIS — L814 Other melanin hyperpigmentation: Secondary | ICD-10-CM | POA: Diagnosis not present

## 2022-02-26 DIAGNOSIS — D225 Melanocytic nevi of trunk: Secondary | ICD-10-CM | POA: Diagnosis not present

## 2022-02-26 DIAGNOSIS — L218 Other seborrheic dermatitis: Secondary | ICD-10-CM | POA: Diagnosis not present

## 2022-03-06 DIAGNOSIS — I7090 Unspecified atherosclerosis: Secondary | ICD-10-CM | POA: Diagnosis not present

## 2022-03-06 DIAGNOSIS — E559 Vitamin D deficiency, unspecified: Secondary | ICD-10-CM | POA: Diagnosis not present

## 2022-03-06 DIAGNOSIS — R7989 Other specified abnormal findings of blood chemistry: Secondary | ICD-10-CM | POA: Diagnosis not present

## 2022-03-06 DIAGNOSIS — E7210 Disorders of sulfur-bearing amino-acid metabolism, unspecified: Secondary | ICD-10-CM | POA: Diagnosis not present

## 2022-03-13 DIAGNOSIS — H15003 Unspecified scleritis, bilateral: Secondary | ICD-10-CM | POA: Diagnosis not present

## 2022-05-31 DIAGNOSIS — H18832 Recurrent erosion of cornea, left eye: Secondary | ICD-10-CM | POA: Diagnosis not present

## 2022-06-06 DIAGNOSIS — E559 Vitamin D deficiency, unspecified: Secondary | ICD-10-CM | POA: Diagnosis not present

## 2022-06-06 DIAGNOSIS — R7989 Other specified abnormal findings of blood chemistry: Secondary | ICD-10-CM | POA: Diagnosis not present

## 2022-06-06 DIAGNOSIS — I7090 Unspecified atherosclerosis: Secondary | ICD-10-CM | POA: Diagnosis not present

## 2022-06-06 DIAGNOSIS — R5383 Other fatigue: Secondary | ICD-10-CM | POA: Diagnosis not present

## 2022-06-12 DIAGNOSIS — H15003 Unspecified scleritis, bilateral: Secondary | ICD-10-CM | POA: Diagnosis not present

## 2022-07-10 ENCOUNTER — Ambulatory Visit (INDEPENDENT_AMBULATORY_CARE_PROVIDER_SITE_OTHER): Payer: BC Managed Care – PPO | Admitting: Neurology

## 2022-07-10 ENCOUNTER — Encounter: Payer: Self-pay | Admitting: Neurology

## 2022-07-10 VITALS — BP 139/78 | HR 54 | Ht 74.0 in | Wt 185.5 lb

## 2022-07-10 DIAGNOSIS — G4733 Obstructive sleep apnea (adult) (pediatric): Secondary | ICD-10-CM | POA: Diagnosis not present

## 2022-07-10 DIAGNOSIS — H04123 Dry eye syndrome of bilateral lacrimal glands: Secondary | ICD-10-CM | POA: Diagnosis not present

## 2022-07-10 DIAGNOSIS — G4730 Sleep apnea, unspecified: Secondary | ICD-10-CM

## 2022-07-10 NOTE — Patient Instructions (Signed)

## 2022-07-10 NOTE — Progress Notes (Signed)
[facility]  SLEEP MEDICINE CLINIC   Provider:  Dr Aydan Levitz Referring Provider: Shon Baton, MD Primary Care Physician:  Shon Baton, MD  Chief Complaint  Patient presents with   Follow-up    Pt in room #10 and alone. Pt here today for OSA on CPAP.    HPI:  Jacob Lozano is a 66 y.o. male here for compliance with CPAP .  07-10-2022; Rv for [name] , who was unable to use CPAP for 14 days after eye-surgery, having to wear goggles. His compliance report for CPAP is still excellent.  He does not have major air leaks, his compliance up to 8 November was 97%.  Only after surgery did he have a break in compliance for medical reasons.   Mean pressure or 95th percentile pressure was 7.7 cm water average air leak is 1.5 L/min which is negligible.  The average residual AHI is 1.2 there are no central apneas arising and his ramp pressure is 4 cm with a maximum of 12 cm water.  Based on this I am very happy ! The patient  switched his eye care from [name], [name] , to [name] , at [facility]-  the patient had eye surgery on November 10 at the [facility] on [address].  And had a follow-up on November 17.  He had a previous eye surgery October 13.  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  He was placed on Imuran Famvir and Valtrex she is on a weaning schedule for prednisolone eyedrops and for his famciclovir tablets. Blood work - done by Candace Gallus, MD.   He is doing great !    07-25-2021: Rv on 07-25-2021; I had the pleasure of seeing [name] today , who has had his last follow-up in November last year (2021)  with [name], NP.  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 His last  visit note does not state how old his machine is.  So we were not aware to order the much-needed home sleep test to keep track of his baseline.  He had residual apnea hypopnea indices that exceeded what was considered to be a good resolution of apnea.  He received a 3B machine from [facility].  An auto CPAP with a setting from a 4 to 12 cm water pressure.  Start date 06/25/2021.  He has used the machine consistently since it was just recently ordered.  And his average pressure need is 8.1 cmH2O at the 95th percentile.  He does not have any air leakage of significance.  His residual AHI is 1.6/h which is an ideal resolution.  He endorsed the Epworth Sleepiness Scale at only 3 points and the fatigue severity scale was not endorsed the geriatric depression scale 1/ 15.  He is in early retirement.      03-18-2018, RV with CPAP compliance. Recovered by now completely from his injury. 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  Looks great his compliance is is usually excellent, he has used the device 87% and each of these nights over 4 hours, he has also used his auto CPAP with a mean  pressure of 7.7 cmH2O the average pressure at night is 9.4 and the average time in large leak is 0 seconds (which is a very good result )residual AHI is around 4.0  also considering hypopneas  Minimal pressure is 5 cm , the maximum pressure is 12 cm.  There is an EPR of 1 cm water is using the humidifier at settings 3.   In January this year 2018 he had a gastrointestinal viral infect and was very sick. This is a six-month revisit after the first 30 days but in January 2014 the patient was placed on an auto-titrator CPAP for 30 days, after a HST confirmed OSA . [name] is an ambidextrous, caucasian married gentleman, who was originally referred for loud snoring and sleep talking and his wife had witnessed apneas.  He may have trouble  falling asleep or staying asleep. He does not use caffeine unless  a.m. he drinks about 6 beers weekly.  His  sleep time is between 10:30 and 6:30 AM, and he rarely has nocturia. And also has no jaw or neck surgery or surgical alteration of the upper airway. Patient was in January 2014 seen for a CPAP follow up- at the time he was 97% compliant -reported that he was looking forward to using the machine,  and felt more refreshed in the morning the auto- titration followed a home sleep test, that revealed an AHI of 19 in  December 2013. The patient endorsed the Epworth Sleepiness Scale at 4 points and the fatigue severity scale at 18 points in his last visit 6 month ago.  The download shows 100% compliance and residual AHI of 4.3 and average user time of 7 hours and 14 minutes. This is fairly similar to the results of generally. The average device pressure is 8.4 cm the patient reports that he actually looks forward to wearing his CPAP as he is followed by a [facility] in [place] [facility] [place] for his DME needs-  He is using a nasal comfort mask, not a nasal pillow.  [employer] downloads available today, 03-03-14, AHI residual at 3.8 and user time 6 hours and 34 minutes, 100% compliance for 30 days.  Patient has traits of OCD , ADHD - and focusses much better since on CPAP. He has never used SSRIs.   03-09-15 Epworth sleepiness score endorsed at 5 points and fatigue severity at 19 points. Only during February of this year that the patient have a period of " noncompliance "but he suffered from a sinus and upper respiratory tract infection. Otherwise he has been a compliant user and he has seen the difference that the CPAP use mates in his daytime alertness and productivity. He has had 4 nights of insomnia a year, he dreams , he is refreshed in AM.  He takes antihistamines for seasonal rhinitis.  The download reveals an average use of the CPAP machine for 6 hours and 13 minutes per night. The average hypotony index is around 3.0 there are very few true apneas measured. The residual AHI for  the night from 7:15 09/26/2014 for example at a complete AHI of 2.5 and an average also pressure of 5.8. At the 90th percentile pressure the patient uses 8 cm water. Since this is an auto VPAP the patient can continue with the current settings that needs to be no adjustments made. He  Will get new supplies from his DME, namely [facility] based in [place], [place].  Interval history from 03/05/2016. We are here for her yearly compliance  review with Mr. Brendlinger his compliance has been excellent at 97% with an average user time of 7 hours and 4 minutes and a residual AHI of 3.7. He is using the same humidification settings he has a brief ramp time of 5 minutes and uses a minimum pressure of 4 and a maximum pressure of 12 cm water. Based on these results is no adjustment to be made. He enjoys his new home of the last year, still renovating.   Interval history from 03/07/2017, Mr. Elison is here today for his routine compliance visit. He is a highly compliant CPAP user with 97% compliance over the last 30 days, average user time 7 hours and 4 minutes at night, his average AHI is 3.7 he does not have high air leaks, he is using an auto CPAP with a 90 percentile pressure of 8.6 cm water. There is no evidence of central apneas emerging under treatment. He does not have major air leaks.     Review of Systems: Out of a complete 14 system review, the patient complains of only the following symptoms, and all other reviewed systems are negative. Epworth 3 , fatigue severity score ( FSS) 16.   Pain with bicycling , had a muscle tear since 2014 , not completely resolved.   How likely are you to doze in the following situations: 0 = not likely, 1 = slight chance, 2 = moderate chance, 3 = high chance  Sitting and Reading? Watching Television? Sitting inactive in a public place (theater or meeting)? Lying down in the afternoon when circumstances permit? Sitting and talking to someone? Sitting  quietly after lunch without alcohol? In a car, while stopped for a few minutes in traffic? As a passenger in a car for an hour without a break?  Total = 3/ 24  FSS at 11/ 63.   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   Patient is working full-time.   Patient has a BSBA degree.   Patient is ambi-dextrous.   Patient drinks two cups of tea and 1/2 cup of soda and nothing after 1 pm.   Social Determinants of Health   Financial Resource Strain: Not on file  Food Insecurity: Not on file  Transportation Needs: Not on file  Physical Activity: Not on file  Stress: Not on file  Social Connections: Not on file  Intimate Partner Violence: Not on file    Family History  Problem Relation Age of Onset   Heart disease Father    Hypertension Father    Diabetes Neg Hx    Hyperlipidemia Neg Hx    Sudden death Neg Hx     Past Medical History:  Diagnosis Date   Cancer ([place]) 2012   basal cell ca on nose   Hernia    Hyperlipidemia    Obstructive apnea    CPAP  Ulcer     Past Surgical History:  Procedure Laterality Date   APPENDECTOMY  2010   HERNIA REPAIR  03/22/2011   Lap bilateral obturator & femoral hernia repairs    Current Outpatient Medications  Medication Sig Dispense Refill   famciclovir (FAMVIR) 500 MG tablet Take 500 mg by mouth 3 (three) times daily.     prednisoLONE acetate (PRED FORTE) 1 % ophthalmic suspension 1 drop 4  (four) times daily.     azaTHIOprine (IMURAN) 50 MG tablet Take 1 tablet by mouth daily. (Patient not taking: Reported on 07/10/2022)     meclizine (ANTIVERT) 25 MG tablet Take 1 tablet (25 mg total) by mouth 3 (three) times daily as needed for dizziness. (Patient not taking: Reported on 07/10/2022) 30 tablet 0   No current facility-administered medications for this visit.    Allergies as of 07/10/2022   (No Known Allergies)    Vitals: BP 139/78 (BP Location: Left Arm, Patient Position: Sitting, Cuff Size: Small)   Pulse (!) 54   Ht '6\' 2"'$  (1.88 m)   Wt 185 lb 8 oz (84.1 kg)   BMI 23.82 kg/m  Last Weight:  Wt Readings from Last 1 Encounters:  07/10/22 185 lb 8 oz (84.1 kg)   Last Height:   Ht Readings from Last 1 Encounters:  07/10/22 '6\' 2"'$  (1.88 m)   Vision Screening:  Left eye with correction .    Right eye with correction  20/30 .  Uses the same glasses for 2 years.   Physical exam:  General: The patient is awake, alert and appears not in acute distress. The patient is well groomed. Head: Normocephalic, atraumatic. Neck is supple. Mallampati 3 , neck circumference: 38 cm  Cardiovascular:  Regular rate and rhythm, without  murmurs or carotid bruit, and without distended neck veins. Respiratory: Lungs are clear to auscultation. Skin:  Without evidence of edema, or rash Trunk: BMI is normal.  Neurologic exam : Cranial nerves: Pupils are equal and briskly reactive to light. . Visual fields by finger perimetry are intact. Facial motor strength is symmetric and tongue and uvula move midline.  Motor exam:   Normal tone and normal muscle bulk and symmetric normal strength in all extremities. Assessment:  After physical and neurologic examination, review of laboratory studies, imaging, neurophysiology testing and pre-existing records,   CPAP - highly compliant . he still snores if not on CPAP.  OSA on CPAP - 100% compliance, overall benefit for the patient is high, he likes  CPAP and sleeps better and deeper.   Plan:  Treatment plan - continued use of CPAP , unchanged in settings, from now on one yearly visit.   [employer].    Larey Seat, MD   Cc Dr. Virgina Jock

## 2022-07-26 ENCOUNTER — Ambulatory Visit: Payer: BC Managed Care – PPO | Admitting: Neurology

## 2022-08-02 ENCOUNTER — Ambulatory Visit: Payer: Self-pay

## 2022-08-02 ENCOUNTER — Ambulatory Visit (INDEPENDENT_AMBULATORY_CARE_PROVIDER_SITE_OTHER): Payer: BC Managed Care – PPO | Admitting: Sports Medicine

## 2022-08-02 VITALS — BP 132/82 | Ht 73.0 in | Wt 181.0 lb

## 2022-08-02 DIAGNOSIS — M79672 Pain in left foot: Secondary | ICD-10-CM

## 2022-08-02 DIAGNOSIS — M67979 Unspecified disorder of synovium and tendon, unspecified ankle and foot: Secondary | ICD-10-CM | POA: Diagnosis not present

## 2022-08-02 NOTE — Assessment & Plan Note (Signed)
He was given specific strengthening exercises Icing Topical Voltaren This should improve over 2 to 3 weeks time Recheck if not resolving

## 2022-08-02 NOTE — Patient Instructions (Signed)
It was great to see you today.  We discussed the following for your heel pain: -Start the exercises (toe walks, heel walks and backwards walking, calf raises with toes pointed in) -Continue to ride your bike. Increase your time on your bike by 5 minutes every 3 days  Call with questions!

## 2022-08-02 NOTE — Progress Notes (Addendum)
  SUBJECTIVE:   Chief Complaint: left heel pain  History of Presenting Illness:   [name] is a 66 year old male with a past medical history of R medial epicondylitis, L Achilles tendinitis, R patellar tendinitis, R shoulder impingement syndrome, and lumbar back pain who presents with left heel pain.   He reports a long history of intermittent left heel pain that has required corticosteroid injections and heel pads in the past. A few weeks ago, he completed a particularly challenging session on his Pel[employer] and felt heel pain immediately afterward. Describes the pain as intermittent, dull, and achy. Worsens and becomes sharp with certain activities, specifically walking barefoot and riding the stationary bike at higher resistances. The pain is located on the medial aspect of his left foot, inferior and posterior to the medial malleolus. Denies numbness, tingling, swelling, or any discoloration. He has been performing heel stretching and ankle range of motion exercises. On several occasions, he took ibuprofen and acetaminophen. Last week, he applied ice and this was helpful in alleviating the pain.   Pertinent Medical History:   R medial epicondylitis L Achilles tendinitis R patellar tendinitis R shoulder impingement syndrome lumbar back pain  Past Medical History:  Diagnosis Date   Cancer ([place]) 2012   basal cell ca on nose   Hernia    Hyperlipidemia    Obstructive apnea    CPAP    Ulcer     OBJECTIVE:   BP 132/82   Ht '6\' 1"'$  (1.854 m)   Wt 181 lb (82.1 kg)   BMI 23.88 kg/m   Physical Examination:  Left foot   Inspection ----- No swelling, discoloration, atrophy, or gross abnormalities Motion --------- Range full with flexion, extension, inversion, and eversion Palpation ------ Tenderness present on medial aspect of ankle inferior and posterior to malleolus, mild tenderness over Achilles Sensation ----- Light touch intact across dermatomes L4-S1 Strength ------- 5/5 with  flexion, extension, inversion, and eversion Tests ----------- Pain reproducible with inversion-flexion against resistance, heel raise test positive for decreased internal rotation of calcaneus upon flexion compared to right side  Ultrasound of left foot: Accessory flexor digitorum longus, mild tibialis posterior edematous changes No significant hypoechoic changes in the tendon sheath Posterior tibialis tendon is intact Flexor digitorum tendon is intact No abnormal Doppler flow or soft tissue swelling is appreciated  Impression: 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  I wonder if this is more common because of his accessory flexor digitorum longus noted on his left side  Ultrasound and interpretation by Wolfgang Phoenix. Fields, MD      ASSESSMENT/PLAN:   Patient's presentation is most consistent with a tibialis posterior strain.  > Cryotherapy and topical cream for symptom relief > Modify insoles to include arch support > Home strengthening exercises targeting tibialis posterior > Rest with gradual return to baseline physical activity as tolerated   There are no diagnoses linked to this encounter.  No follow-ups on file.  Roswell Nickel, MD  08/02/2022, 1:39 PM  I observed and examined the patient with the resident and agree with assessment and plan.  Note reviewed and modified by me.  Ila Mcgill, MD

## 2022-08-03 ENCOUNTER — Encounter: Payer: Self-pay | Admitting: Sports Medicine

## 2022-08-28 DIAGNOSIS — H1132 Conjunctival hemorrhage, left eye: Secondary | ICD-10-CM | POA: Diagnosis not present

## 2022-08-28 DIAGNOSIS — H2513 Age-related nuclear cataract, bilateral: Secondary | ICD-10-CM | POA: Diagnosis not present

## 2022-08-28 DIAGNOSIS — B0052 Herpesviral keratitis: Secondary | ICD-10-CM | POA: Diagnosis not present

## 2022-08-28 DIAGNOSIS — H18833 Recurrent erosion of cornea, bilateral: Secondary | ICD-10-CM | POA: Diagnosis not present

## 2022-09-04 DIAGNOSIS — I7090 Unspecified atherosclerosis: Secondary | ICD-10-CM | POA: Diagnosis not present

## 2022-09-04 DIAGNOSIS — R7989 Other specified abnormal findings of blood chemistry: Secondary | ICD-10-CM | POA: Diagnosis not present

## 2022-09-04 DIAGNOSIS — E559 Vitamin D deficiency, unspecified: Secondary | ICD-10-CM | POA: Diagnosis not present

## 2022-09-04 DIAGNOSIS — N529 Male erectile dysfunction, unspecified: Secondary | ICD-10-CM | POA: Diagnosis not present

## 2022-09-11 DIAGNOSIS — R739 Hyperglycemia, unspecified: Secondary | ICD-10-CM | POA: Diagnosis not present

## 2022-09-11 DIAGNOSIS — Z1212 Encounter for screening for malignant neoplasm of rectum: Secondary | ICD-10-CM | POA: Diagnosis not present

## 2022-09-11 DIAGNOSIS — Z125 Encounter for screening for malignant neoplasm of prostate: Secondary | ICD-10-CM | POA: Diagnosis not present

## 2022-09-11 DIAGNOSIS — H15003 Unspecified scleritis, bilateral: Secondary | ICD-10-CM | POA: Diagnosis not present

## 2022-09-11 DIAGNOSIS — R030 Elevated blood-pressure reading, without diagnosis of hypertension: Secondary | ICD-10-CM | POA: Diagnosis not present

## 2022-09-11 DIAGNOSIS — E559 Vitamin D deficiency, unspecified: Secondary | ICD-10-CM | POA: Diagnosis not present

## 2022-09-11 DIAGNOSIS — E785 Hyperlipidemia, unspecified: Secondary | ICD-10-CM | POA: Diagnosis not present

## 2022-09-18 ENCOUNTER — Other Ambulatory Visit: Payer: Self-pay | Admitting: Internal Medicine

## 2022-09-18 DIAGNOSIS — Z0000 Encounter for general adult medical examination without abnormal findings: Secondary | ICD-10-CM | POA: Diagnosis not present

## 2022-09-18 DIAGNOSIS — R739 Hyperglycemia, unspecified: Secondary | ICD-10-CM | POA: Diagnosis not present

## 2022-09-18 DIAGNOSIS — G4733 Obstructive sleep apnea (adult) (pediatric): Secondary | ICD-10-CM | POA: Diagnosis not present

## 2022-09-18 DIAGNOSIS — E785 Hyperlipidemia, unspecified: Secondary | ICD-10-CM | POA: Diagnosis not present

## 2022-09-18 DIAGNOSIS — Z1331 Encounter for screening for depression: Secondary | ICD-10-CM | POA: Diagnosis not present

## 2022-09-18 DIAGNOSIS — Z1339 Encounter for screening examination for other mental health and behavioral disorders: Secondary | ICD-10-CM | POA: Diagnosis not present

## 2022-09-18 DIAGNOSIS — E559 Vitamin D deficiency, unspecified: Secondary | ICD-10-CM | POA: Diagnosis not present

## 2022-09-18 DIAGNOSIS — R82998 Other abnormal findings in urine: Secondary | ICD-10-CM | POA: Diagnosis not present

## 2022-09-27 ENCOUNTER — Ambulatory Visit (INDEPENDENT_AMBULATORY_CARE_PROVIDER_SITE_OTHER): Payer: BC Managed Care – PPO | Admitting: Sports Medicine

## 2022-09-27 VITALS — BP 138/88 | Ht 73.0 in | Wt 180.0 lb

## 2022-09-27 DIAGNOSIS — M67979 Unspecified disorder of synovium and tendon, unspecified ankle and foot: Secondary | ICD-10-CM | POA: Diagnosis not present

## 2022-09-27 NOTE — Progress Notes (Signed)
PCP: Shon Baton, MD  Subjective:   HPI: Patient is a 67 y.o. male here for f/u on tibialis posterior strain seen on ultrasound on 08/02/2022.  He was given home strengthening exercises to complete which he states he has been doing daily and has been icing ankle daily (10 minutes on/10 minutes off for 3 cycles). Is riding the stationary bike, up to 45 minutes and pushing hard, including standing while riding.  Not yet riding riding outside but wants to.  Pain is much improved since last visit, not bad enough for pain medication.  Still feels like ankle overall is a little weak.   BP 138/88   Ht '6\' 1"'$  (1.854 m)   Wt 180 lb (81.6 kg)   BMI 23.75 kg/m       Objective:  Physical Exam: Thin W M in NAD  L Ankle: - Inspection: No obvious deformity, erythema, swelling, or ecchymosis - Palpation: TTP posterior and inferior to medial malleolus - Strength: 5/5 muscle strength with dorsiflexion, plantarflexion, inversion, and eversion of foot; mild pain with inversion against full resistance  - ROM: Full ROM - Neuro/vasc: NV intact - Heel raise test negative   Assessment & Plan:   Tibialis posterior strain Patient reports improvement which is also seen on physical exam today with normal heel rise test.  Continued pain could be result of accessory tendon crowding injured area.  -Trial cycling outside, avoid standing -Continue home strengthening exercises for tibialis posterior -Continue icing cycles -Follow-up in 6 weeks if needed  Precious Gilding, DO Family medicine resident, PGY-2  I observed and examined the patient with the resident and agree with assessment and plan.  Note reviewed and modified by me. Ila Mcgill, MD

## 2022-09-27 NOTE — Assessment & Plan Note (Signed)
Significant improvement on exam. Strength testing and on symptoms. I expect he should be better by 6 weeks Reck if not

## 2022-10-18 ENCOUNTER — Ambulatory Visit
Admission: RE | Admit: 2022-10-18 | Discharge: 2022-10-18 | Disposition: A | Discharge status: Home / Self Care | Payer: BC Managed Care – PPO | Source: Ambulatory Visit | Attending: Internal Medicine | Admitting: Internal Medicine

## 2022-10-18 DIAGNOSIS — Z0000 Encounter for general adult medical examination without abnormal findings: Secondary | ICD-10-CM

## 2022-10-18 DIAGNOSIS — E785 Hyperlipidemia, unspecified: Secondary | ICD-10-CM | POA: Diagnosis not present

## 2022-10-25 DIAGNOSIS — H15003 Unspecified scleritis, bilateral: Secondary | ICD-10-CM | POA: Diagnosis not present

## 2022-11-06 ENCOUNTER — Ambulatory Visit: Payer: BC Managed Care – PPO | Admitting: Sports Medicine

## 2022-11-22 DIAGNOSIS — G4733 Obstructive sleep apnea (adult) (pediatric): Secondary | ICD-10-CM | POA: Diagnosis not present

## 2022-12-22 DIAGNOSIS — G4733 Obstructive sleep apnea (adult) (pediatric): Secondary | ICD-10-CM | POA: Diagnosis not present

## 2023-01-02 DIAGNOSIS — R7989 Other specified abnormal findings of blood chemistry: Secondary | ICD-10-CM | POA: Diagnosis not present

## 2023-01-02 DIAGNOSIS — E559 Vitamin D deficiency, unspecified: Secondary | ICD-10-CM | POA: Diagnosis not present

## 2023-01-02 DIAGNOSIS — I7090 Unspecified atherosclerosis: Secondary | ICD-10-CM | POA: Diagnosis not present

## 2023-01-02 DIAGNOSIS — E7210 Disorders of sulfur-bearing amino-acid metabolism, unspecified: Secondary | ICD-10-CM | POA: Diagnosis not present

## 2023-01-07 DIAGNOSIS — H15003 Unspecified scleritis, bilateral: Secondary | ICD-10-CM | POA: Diagnosis not present

## 2023-01-22 DIAGNOSIS — G4733 Obstructive sleep apnea (adult) (pediatric): Secondary | ICD-10-CM | POA: Diagnosis not present

## 2023-07-02 ENCOUNTER — Other Ambulatory Visit: Payer: Self-pay | Admitting: Surgery

## 2023-07-02 DIAGNOSIS — I2584 Coronary atherosclerosis due to calcified coronary lesion: Secondary | ICD-10-CM

## 2023-07-04 ENCOUNTER — Ambulatory Visit
Admission: RE | Admit: 2023-07-04 | Discharge: 2023-07-04 | Disposition: A | Discharge status: Home / Self Care | Payer: No Typology Code available for payment source | Source: Ambulatory Visit | Attending: Surgery | Admitting: Surgery

## 2023-07-04 DIAGNOSIS — I2584 Coronary atherosclerosis due to calcified coronary lesion: Secondary | ICD-10-CM

## 2023-07-10 ENCOUNTER — Encounter: Payer: Self-pay | Admitting: Family Medicine

## 2023-07-10 ENCOUNTER — Ambulatory Visit: Payer: Self-pay | Admitting: Family Medicine

## 2023-07-10 ENCOUNTER — Other Ambulatory Visit: Payer: Self-pay

## 2023-07-10 VITALS — BP 118/78 | Ht 73.0 in | Wt 170.0 lb

## 2023-07-10 DIAGNOSIS — M25562 Pain in left knee: Secondary | ICD-10-CM | POA: Diagnosis not present

## 2023-07-10 DIAGNOSIS — G8929 Other chronic pain: Secondary | ICD-10-CM

## 2023-07-10 DIAGNOSIS — M7651 Patellar tendinitis, right knee: Secondary | ICD-10-CM | POA: Diagnosis not present

## 2023-07-10 DIAGNOSIS — M25561 Pain in right knee: Secondary | ICD-10-CM | POA: Diagnosis not present

## 2023-07-10 DIAGNOSIS — M7652 Patellar tendinitis, left knee: Secondary | ICD-10-CM | POA: Diagnosis not present

## 2023-07-10 NOTE — Assessment & Plan Note (Signed)
-   Jacob Lozano researched and started the appropriate therapy for his patellar tendinitis and has been seeing some improvements. - As he does not like to take oral medications we discussed the use of ice, topical Voltaren gel, patellar tendon straps, and modifications to his regimen. - We added heel lift squats for eccentric loading of the patellar tendon as well as straight leg raises with external rotation to focus on the VMO - We will follow-up in 4-6 weeks to reevaluate.  If he is completely without pain at that point he can cancel his appointment

## 2023-07-10 NOTE — Progress Notes (Signed)
  Jacob Lozano - 67 y.o. male MRN [id number]  Date of birth: [birth year]  PCP: Jacob Corn, MD  Subjective:  No chief complaint on file. Bilateral knee pain  HPI: Past Medical, Surgical, Social, and Family History Reviewed & Updated per EMR.   Patient is a 67 y.o. male here for pain in the inferior portion of both knees that started several weeks ago.  He changed his routine from his regular stationary cycling to some increased jogging/walking.  Then with resumption of his cycling he had increased pain that did not radiate, was not associated with swelling, warmth, fever, or chills.  Since then he has been icing the area, he started quadricep strengthening exercises, quadriceps stretching, and massaging the area, all of which have helped.  He feels the symptoms have improved but wanted to make sure he was on the right track.  He has not had any falls or trauma to the area, distal numbness or weakness.    Past Surgical History:  Procedure Laterality Date   APPENDECTOMY  2010   HERNIA REPAIR  03/22/2011   Lap bilateral obturator & femoral hernia repairs    No Known Allergies      Objective:  Physical Exam: VS: BP:118/78  HR: bpm  TEMP: ( )  RESP:   HT:6\' 1"  (185.4 cm)   WT:170 lb (77.1 kg)  BMI:22.43  Gen: Well developed, NAD, speaks clearly, comfortable in exam room Respiratory: Normal work of breathing on room air, no respiratory distress Skin: No rashes, abrasions, or ecchymosis MSK:  Bilateral knee: Inspection: No abnormalities ROM: Full Strength: 5/5 in extension and flexion with some pain elicited on resisted extension over the tibial tuberosity Palpation w/ out warmth, no effusion present, no posterior tenderness No joint line tenderness to palpation  No tenderness over Gerdy's tubercle, pes bursa No patellar tenderness, translation nontender No condyle tenderness. Special Tests: Varus and valgus stress negative with good endpoints Collateral ligaments:  Anterior/posterior drawer negative Meniscus: Apley's negative Neuro: No sensory deficits  Limited ultrasound of the knees No effusion in the suprapatellar pouch Patellar tendon has some hypoechoic changes within the tendon at the distal insertion point just proximal to the tibial tuberosity.  There are no avulsions but a small osteophyte present on the right tibial tuberosity.  Visualized in LAX and SAX and quadriceps tendons is normal  Summary: Findings consistent with bilateral patellar tendinitis  Ultrasound and interpretation by Dr. Webb Silversmith and Dr. Christella Hartigan   Assessment & Plan:   Patellar tendinitis of both knees - Jacob Lozano researched and started the appropriate therapy for his patellar tendinitis and has been seeing some improvements. - As he does not like to take oral medications we discussed the use of ice, topical Voltaren gel, patellar tendon straps, and modifications to his regimen. - We added heel lift squats for eccentric loading of the patellar tendon as well as straight leg raises with external rotation to focus on the VMO - We will follow-up in 4-6 weeks to reevaluate.  If he is completely without pain at that point he can cancel his appointment    Rica Mote MD [facility] Sports Medicine Fellow  Addendum:  Patient seen and examined in the office by fellow.   History, exam, plan of care were precepted with me.  Agree with findings as documented in fellow note.  Darene Lamer, DO, CAQSM

## 2023-07-11 NOTE — Progress Notes (Deleted)
PATIENT: Jacob Lozano DOB: 1956/05/10  REASON FOR VISIT: follow up HISTORY FROM: patient  No chief complaint on file.    HISTORY OF PRESENT ILLNESS:  07/11/23 ALL: Jacob Lozano returns for follow up for OSA on CPAP. He was seen by Dr Vickey Huger 07/2021 and 06/2022 and doing well. He continues CPAP nightly for about     06/20/2020 ALL: Jacob Lozano is a 67 y.o. male here today for follow up for OSA on CPAP.  He is doing very well.  He is using CPAP nightly and denies any concerns.  He has not had any difficulty obtaining supplies from DME.  He does note that his machine is approximately 39 or 65 years old.  Compliance report dated 05/21/2020 through 06/19/2020 reveals he has used CPAP 30 of the past 30 days for compliance of 100%.  He used CPAP greater than 4 hours all 30 days.  Average usage was 8 hours and 12 minutes.  Residual AHI was 5.1 on 4 to 12 cm of water.  There was no significant leak noted.   06/18/2019 ALL: Jacob Lozano is a 67 y.o. male here today for follow up for OSA on CPAP. He is doing well.  He continues CPAP nightly and for greater than 4 hours each night.  He admits to more stress recently due to pandemic and changes within his place of employment.  He has a history of stomach ulcers thought to be induced by anxiety.  He continues Prilosec daily.  He follows up with primary care and a holistic provider.  Compliance report dated 05/19/2019 through 06/17/2019 reveals that he has used CPAP every night for compliance of 100%.  Every night he is used CPAP greater than 4 hours for compliance of 100%.  Average usage was 8 hours and 16 minutes.  AHI was 4.5 on 4 to 12 cm of water.  There was no significant leak noted.   HISTORY: (copied from Dr Dohmeier's note on 03/18/2018)   HPI:  Jacob Lozano is a 67 y.o. male here as a referral from Dr. Timothy Lasso for CPAP follow up.   03-18-2018, RV with CPAP compliance. Recovered by now completely from his injury. 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  Looks great his compliance is is usually excellent, he has used the device 87% and each of these nights over 4 hours, he has also used his auto CPAP with a mean pressure of 7.7 cmH2O the average pressure at night is 9.4 and the average time in large leak is 0 seconds (which is a very good result )residual AHI is around 4.0  also considering hypopneas  Minimal pressure is 5 cm , the maximum pressure is 12 cm.  There is an EPR of 1 cm water is using the humidifier at settings 3.     In January this year 2018 he had a gastrointestinal viral infect and was very sick. This is a six-month revisit after the first 30 days but in January 2014 the patient was placed on an auto-titrator CPAP for 30 days, after a HST confirmed OSA . Jacob Lozano is an ambidextrous, caucasian married gentleman, who was originally referred for loud snoring and sleep talking and his wife had witnessed apneas.  He may have trouble  falling asleep or staying asleep. He does not use caffeine unless a.m. he drinks about 6 beers weekly.  His  sleep time is between 10:30 and  6:30 AM, and he rarely has nocturia. And also has no jaw or neck surgery or surgical alteration of the upper airway. Patient was in January 2014 seen for a CPAP follow up- at the time he was 97% compliant -reported that he was looking forward to using the machine,  and felt more refreshed in the morning the auto- titration followed a home sleep test, that revealed an AHI of 19 in  December 2013. The patient endorsed the Epworth Sleepiness Scale at 4 points and the fatigue severity scale at 18 points in his last visit 6 month ago.  The download shows 100% compliance and residual AHI of 4.3 and average user time of 7 hours and 14 minutes. This is fairly similar to the results of generally. The average device pressure is 8.4 cm the patient reports that he actually looks forward to wearing his CPAP as he is followed by [facility] in [place] for his DME needs-  He is using a nasal comfort mask, not a nasal pillow.   [employer] downloads available today, 03-03-14, AHI residual at 3.8 and user time 6 hours and 34 minutes, 100% compliance for 30 days.  Patient has traits of OCD , ADHD - and focusses much better since on CPAP. He has never used SSRIs.    03-09-15 Epworth sleepiness score endorsed at 5 points and fatigue severity at 19 points. Only during February of this year that the patient have a period of " noncompliance "but he suffered from a sinus and upper respiratory tract infection. Otherwise he has been a compliant user and he has seen the difference that the CPAP use mates in his daytime alertness and productivity. He has had 4 nights of insomnia a year, he dreams , he is refreshed in AM.  He takes antihistamines for seasonal rhinitis.  The download reveals an average use of the CPAP machine for 6 hours and 13 minutes per night. The average hypotony index is around 3.0 there are very few true apneas measured. The residual AHI for the night from 7:15 09/26/2014 for example at a complete AHI of 2.5 and an average also pressure of 5.8. At the 90th percentile pressure the patient uses 8 cm water. Since this is an auto VPAP the patient can continue with the current settings that needs to be no adjustments made. He  Will get new supplies from his DME, namely [facility] based in [place], [place].   Interval history from 03/05/2016. We are here for her yearly compliance review with Jacob Lozano his compliance has been excellent at 97% with an average user time of 7 hours and 4 minutes and a residual AHI of 3.7. He is using the same humidification settings he has a brief ramp time of 5 minutes and uses a minimum pressure of 4 and a maximum pressure of 12 cm water. Based on these results is no adjustment to be made. He enjoys his new home of the last year, still renovating.    Interval history from 03/07/2017, Mr.  Lozano is here today for his routine compliance visit. He is a highly compliant CPAP user with 97% compliance over the last 30 days, average user time 7 hours and 4 minutes at night, his average AHI is 3.7 he does not have high air leaks, he is using an auto CPAP with a 90 percentile pressure of 8.6 cm water. There is no evidence of central apneas emerging under treatment. He does not have major air leaks.  REVIEW OF SYSTEMS: Out of a complete 14 system review of symptoms, the patient complains only of the following symptoms, and all other reviewed systems are negative.  ESS: 2 FSS: 16  ALLERGIES: No Known Allergies  HOME MEDICATIONS: Outpatient Medications Prior to Visit  Medication Sig Dispense Refill   azaTHIOprine (IMURAN) 50 MG tablet Take 1 tablet by mouth daily. (Patient not taking: Reported on 07/10/2022)     famciclovir (FAMVIR) 500 MG tablet Take 500 mg by mouth 3 (three) times daily.     meclizine (ANTIVERT) 25 MG tablet Take 1 tablet (25 mg total) by mouth 3 (three) times daily as needed for dizziness. (Patient not taking: Reported on 07/10/2022) 30 tablet 0   prednisoLONE acetate (PRED FORTE) 1 % ophthalmic suspension 1 drop 4 (four) times daily.     No facility-administered medications prior to visit.    PAST MEDICAL HISTORY: Past Medical History:  Diagnosis Date   Cancer (HCC) 2012   basal cell ca on nose   Hernia    Hyperlipidemia    Obstructive apnea    CPAP    Ulcer     PAST SURGICAL HISTORY: Past Surgical History:  Procedure Laterality Date   APPENDECTOMY  2010   HERNIA REPAIR  03/22/2011   Lap bilateral obturator & femoral hernia repairs    FAMILY HISTORY: Family History  Problem Relation Age of Onset   Heart disease Father    Hypertension Father    Diabetes Neg Hx    Hyperlipidemia Neg Hx    Sudden death Neg Hx     SOCIAL HISTORY: Social History   Socioeconomic History   Marital status: Married    Spouse name: Trula Ore   Number of  children: 0   Years of education: BSBA   Highest education level: Not on file  Occupational History    Employer: [employer]  Tobacco Use   Smoking status: Never   Smokeless tobacco: Never  Substance and Sexual Activity   Alcohol use: Yes    Alcohol/week: 0.0 standard drinks of alcohol   Drug use: No   Sexual activity: Not on file  Other Topics Concern   Not on file  Social History Narrative   Patient is married Teacher, 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   Patient is working full-time.   Patient has a BSBA degree.   Patient is ambi-dextrous.   Patient drinks two cups of tea and 1/2 cup of soda and nothing after 1 pm.   Social Determinants of Health   Financial Resource Strain: Not on file  Food Insecurity: Not on file  Transportation Needs: Not on file  Physical Activity: Not on file  Stress: Not on file  Social Connections: Not on file  Intimate Partner Violence: Not on file     PHYSICAL EXAM  There were no vitals filed for this visit.  There is no height or weight on file to calculate BMI.  Generalized: Well developed, in no acute distress  Cardiology: normal rate and rhythm, no murmur noted Respiratory: clear to auscultation bilaterally  Neurological examination  Mentation: Alert oriented to time, place, history taking. Follows all commands speech and language fluent Cranial nerve II-XII: Pupils were equal round reactive to light. Extraocular movements were full, visual field were full  Motor: The motor testing reveals 5 over 5 strength of all 4 extremities. Good symmetric motor tone is noted throughout.  Gait and station: Gait is normal.    DIAGNOSTIC DATA (LABS, IMAGING, TESTING) -  I reviewed patient records, labs, notes, testing and imaging myself where available.      No data to display           Lab Results  Component Value Date   WBC 4.1 12/06/2021   HGB 15.8 12/06/2021   HCT 46.8 12/06/2021   MCV 105.6 (H) 12/06/2021   PLT 167  12/06/2021      Component Value Date/Time   NA 139 12/06/2021 1045   K 4.2 12/06/2021 1045   CL 104 12/06/2021 1045   CO2 29 12/06/2021 1045   GLUCOSE 131 (H) 12/06/2021 1045   BUN 18 12/06/2021 1045   CREATININE 0.98 12/06/2021 1045   CALCIUM 9.7 12/06/2021 1045   GFRNONAA >60 12/06/2021 1045   GFRAA >90 08/02/2012 2006   No results found for: "CHOL", "HDL", "LDLCALC", "LDLDIRECT", "TRIG", "CHOLHDL" No results found for: "HGBA1C" No results found for: "VITAMINB12" No results found for: "TSH"   ASSESSMENT AND PLAN 67 y.o. year old male  has a past medical history of Cancer (HCC) (2012), Hernia, Hyperlipidemia, Obstructive apnea, and Ulcer. here with   No diagnosis found.    Aquarius T Yon is doing well on CPAP therapy. He was encouraged to continue using CPAP nightly and for greater than 4 hours each night. We will update supply orders as indicated. Risks of untreated sleep apnea review and education materials provided. Healthy lifestyle habits encouraged. He will follow up in 1 year, sooner if needed (will need f/u 31-90 days post CPAP set up). He verbalizes understanding and agreement with this plan.    No orders of the defined types were placed in this encounter.    No orders of the defined types were placed in this encounter.     Shawnie Dapper, FNP-C 07/11/2023, 9:57 AM [facility] [address], [address] [place], [place] [address] [phone]

## 2023-07-16 ENCOUNTER — Ambulatory Visit: Payer: Medicare Other | Admitting: Family Medicine

## 2023-07-16 DIAGNOSIS — G4733 Obstructive sleep apnea (adult) (pediatric): Secondary | ICD-10-CM

## 2023-07-24 ENCOUNTER — Other Ambulatory Visit: Payer: Self-pay

## 2023-07-24 ENCOUNTER — Encounter: Payer: Self-pay | Admitting: Family Medicine

## 2023-07-24 ENCOUNTER — Ambulatory Visit (INDEPENDENT_AMBULATORY_CARE_PROVIDER_SITE_OTHER): Payer: Medicare Other | Admitting: Family Medicine

## 2023-07-24 VITALS — BP 118/84 | Ht 73.0 in | Wt 171.0 lb

## 2023-07-24 DIAGNOSIS — M75102 Unspecified rotator cuff tear or rupture of left shoulder, not specified as traumatic: Secondary | ICD-10-CM | POA: Diagnosis not present

## 2023-07-24 DIAGNOSIS — M12812 Other specific arthropathies, not elsewhere classified, left shoulder: Secondary | ICD-10-CM | POA: Diagnosis not present

## 2023-07-24 DIAGNOSIS — M25512 Pain in left shoulder: Secondary | ICD-10-CM

## 2023-07-24 MED ORDER — MELOXICAM 15 MG PO TABS: 15.0000 mg | ORAL_TABLET | Freq: Every day | ORAL | 0 refills | Status: DC

## 2023-07-24 NOTE — Progress Notes (Unsigned)
Jacob Lozano - 67 y.o. male MRN [id number]  Date of birth: 1956/01/26  PCP: Creola Corn, MD  Subjective:  No chief complaint on file. Left shoulder pain  HPI: Past Medical, Surgical, Social, and Family History Reviewed & Updated per EMR.   Patient is a 67 y.o. male here for left shoulder pain that has been ongoing for several months.  It is an achy pain in the lateral portion of the deltoid which is caused him to have to modify some of his exercises.  He now avoids planks and side planks and is using very light weights so he does not aggravate the injury.  He has not tried any ice, therapies, or oral medications.  Past Medical History:  Diagnosis Date   Cancer (HCC) 2012   basal cell ca on nose   Hernia    Hyperlipidemia    Obstructive apnea    CPAP    Ulcer     Current Outpatient Medications on File Prior to Visit  Medication Sig Dispense Refill   azaTHIOprine (IMURAN) 50 MG tablet Take 1 tablet by mouth daily. (Patient not taking: Reported on 07/10/2022)     famciclovir (FAMVIR) 500 MG tablet Take 500 mg by mouth 3 (three) times daily.     meclizine (ANTIVERT) 25 MG tablet Take 1 tablet (25 mg total) by mouth 3 (three) times daily as needed for dizziness. (Patient not taking: Reported on 07/10/2022) 30 tablet 0   prednisoLONE acetate (PRED FORTE) 1 % ophthalmic suspension 1 drop 4 (four) times daily.     No current facility-administered medications on file prior to visit.    Past Surgical History:  Procedure Laterality Date   APPENDECTOMY  2010   HERNIA REPAIR  03/22/2011   Lap bilateral obturator & femoral hernia repairs    No Known Allergies      Objective:  Physical Exam: VS: BP:118/84  HR: bpm  TEMP: ( )  RESP:   HT:6\' 1"  (185.4 cm)   WT:171 lb (77.6 kg)  BMI:22.57  Gen: NAD, speaks clearly, comfortable in exam room Respiratory: Normal respiratory effort on room air. No signs of distress Skin: No rashes, abrasions, or ecchymosis MSK:   Shoulder: Inspection no abnormalities ROM: Full in flexion, limited to 110 degrees of abduction, internal rotation mildly limited but can rotate past midline, external rotation at 60 degrees and some pain with passive movement Strength: 5/5 in all directions AC joint: Nontender Special tests: Speeds positive, Neer's, Hawkins, crossover, lift off, empty can, and O'Brien's are all negative.  Empty can with thumbs up does elicit some pain  Ultrasound of left shoulder:  Biceps Tendon SAX and LAX: visualized in bicipital groove w/ trace intra tendinous hypoechoic changes but no hypoechoic fluid surrounding the tendon, pectoralis and subscapularis insertions in tact, IR/ER does not demonstrate popping of the tendon out of the groove Subscapularis tendon - viewed in SAX and LAX inserting into the inferior lesser tubercle of humerus. echogenics: hypoechoic changes within the tendon confirmed in both views AC joint -mildly arthritic, w/ trace osteophyte formation, no giser sign Supraspinatus tendon - viewed in SAX and LAX, tendon in tact, insertion at superior facet of greater tubercle of the humerus w/ echogenics: hypoechoic changes within the tendon confirmed in both views Infraspinatus and Teres minor tendons - viewed in LAX and SAX at the middle facet of the greater tubercle of humerus w/ echogenics: Hypoechoic changes within the distal infraspinatus  Summary: Partial tearing of the distal supraspinatus, infraspinatus, and subscapularis  Ultrasound and interpretation by Dr. Webb Silversmith and Dr. Christella Hartigan       Assessment & Plan:   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 - The history and exam along with ultrasound findings are consistent with rotator cuff tendinopathy.  He does not have any full-thickness tears and strength is intact. - We will start a short 3-4-week course of oral meloxicam for anti-inflammatory effect - Home exercises given for rotator cuff strengthening.  The patient has been  very diligent doing his knee exercises and will hopefully do well with these. - We will follow-up in 4-6 weeks for reevaluation or sooner as needed     Rica Mote MD [facility] Sports Medicine Fellow  Addendum:  Patient seen and examined in the office by fellow.   History, exam, plan of care were precepted with me.  Agree with findings as documented in fellow note.  Darene Lamer, DO, CAQSM

## 2023-07-24 NOTE — Assessment & Plan Note (Signed)
-   The history and exam along with ultrasound findings are consistent with rotator cuff tendinopathy.  He does not have any full-thickness tears and strength is intact. - We will start a short 3-4-week course of oral meloxicam for anti-inflammatory effect - Home exercises given for rotator cuff strengthening.  The patient has been very diligent doing his knee exercises and will hopefully do well with these. - We will follow-up in 4-6 weeks for reevaluation or sooner as needed

## 2023-07-31 ENCOUNTER — Ambulatory Visit: Payer: BC Managed Care – PPO | Admitting: Sports Medicine

## 2023-08-28 ENCOUNTER — Ambulatory Visit: Payer: Medicare Other | Admitting: Family Medicine

## 2023-08-28 ENCOUNTER — Encounter: Payer: Self-pay | Admitting: Family Medicine

## 2023-08-28 VITALS — BP 122/84 | Ht 73.0 in | Wt 171.0 lb

## 2023-08-28 DIAGNOSIS — M25512 Pain in left shoulder: Secondary | ICD-10-CM

## 2023-08-28 DIAGNOSIS — M12812 Other specific arthropathies, not elsewhere classified, left shoulder: Secondary | ICD-10-CM

## 2023-08-28 DIAGNOSIS — M75102 Unspecified rotator cuff tear or rupture of left shoulder, not specified as traumatic: Secondary | ICD-10-CM | POA: Diagnosis not present

## 2023-08-28 NOTE — Assessment & Plan Note (Signed)
-   [name] is progressing well with home exercises but has not been unable to move past certain ROM on his own due to pain. - Formal physical therapy referral was given today as he would likely benefit from this. - He likes to avoid oral medications and states that nitroglycerin  patches on his right shoulder did not help much previously. - We discussed the use of Voltaren gel to see if this is beneficial for him. - Will follow-up in 4 weeks

## 2023-08-28 NOTE — Progress Notes (Signed)
  Jacob Lozano - 68 y.o. male MRN [id number]  Date of birth: 08-01-1956  PCP: Onita Rush, MD  Subjective:  No chief complaint on file. Left rotator cuff tendinopathy  HPI: Past Medical, Surgical, Social, and Family History Reviewed & Updated per EMR.   Patient is a 68 y.o. male here for follow up on left rotator cuff tendinopathy, last seen on 12//2024. The patient has tried home exercises which has improved his strength and range of motion and decreased pain moderately since his last visit.  He still has some pain with abduction and external rotation of the shoulder and feels it over the lateral deltoid.  He has not had any new numbness or weakness distally.  He notes that in the past he did physical therapy on the right side which was very beneficial for him.  He has not been taking any oral medications as he would like to avoid these and use a more holistic approach.  He has not been icing consistently and denies any neck pain..   Past Medical History:  Diagnosis Date   Cancer (HCC) 2012   basal cell ca on nose   Hernia    Hyperlipidemia    Obstructive apnea    CPAP    Ulcer     Current Outpatient Medications on File Prior to Visit  Medication Sig Dispense Refill   meloxicam  (MOBIC ) 15 MG tablet Take 1 tablet (15 mg total) by mouth daily. 30 tablet 0   No current facility-administered medications on file prior to visit.    Past Surgical History:  Procedure Laterality Date   APPENDECTOMY  2010   HERNIA REPAIR  03/22/2011   Lap bilateral obturator & femoral hernia repairs    No Known Allergies      Objective:  Physical Exam: VS: BP:122/84  HR: bpm  TEMP: ( )  RESP:   HT:6' 1 (185.4 cm)   WT:171 lb (77.6 kg)  BMI:22.57  Gen: NAD, speaks clearly, comfortable in exam room Respiratory: Normal respiratory effort on room air. No signs of distress Skin: No rashes, abrasions, or ecchymosis MSK:  Shoulder: Inspection some winging of the inferior most border of the  scapula medially at rest and with arc ROM: Full in all directions Strength: 5/5 in all directions  AC joint: Nontender to palpation Special tests: Empty can equivocal O'Brien's negative Some mild tenderness palpation over the biceps tendon without overlying erythema, edema, or warmth. 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 - Formal physical therapy referral was given today as he would likely benefit from this. - He likes to avoid oral medications and states that nitroglycerin  patches on his right shoulder did not help much previously. - We discussed the use of Voltaren gel to see if this is beneficial for him. - Will follow-up in 4 weeks     Lonni Ford MD [facility] Sports Medicine Fellow  Attestation     Addendum:  I was the preceptor for this visit and available for immediate consultation.  Rainell Cedar DO, CAQSM

## 2023-09-11 ENCOUNTER — Ambulatory Visit: Payer: Medicare Other | Attending: Family Medicine

## 2023-09-11 ENCOUNTER — Other Ambulatory Visit: Payer: Self-pay

## 2023-09-11 DIAGNOSIS — M25612 Stiffness of left shoulder, not elsewhere classified: Secondary | ICD-10-CM | POA: Diagnosis present

## 2023-09-11 DIAGNOSIS — G8929 Other chronic pain: Secondary | ICD-10-CM | POA: Insufficient documentation

## 2023-09-11 DIAGNOSIS — M25512 Pain in left shoulder: Secondary | ICD-10-CM | POA: Diagnosis present

## 2023-09-11 NOTE — Therapy (Signed)
OUTPATIENT PHYSICAL THERAPY SHOULDER EVALUATION   Patient Name: Jacob Lozano MRN: [id number] DOB:[birth year], 68 y.o., male Today's Date: 09/11/2023  END OF SESSION:  Visit Number 1 Number of Visits 7 Date for PT re-eval 10/23/2023  Authorization Type MCR/BCBS  PT start time 1405 PT stop time 1445 PT time calculation (min) 40 min  Behavior During Therapy: [facility] for tasks assessed/performed    Past Medical History:  Diagnosis Date   Cancer (HCC) 2012   basal cell ca on nose   Hernia    Hyperlipidemia    Obstructive apnea    CPAP    Ulcer    Past Surgical History:  Procedure Laterality Date   APPENDECTOMY  2010   HERNIA REPAIR  03/22/2011   Lap bilateral obturator & femoral hernia repairs   Patient Active Problem List   Diagnosis Date Noted   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 07/24/2023   Tibialis posterior tendinopathy 08/02/2022   Dry eye syndrome of both eyes 07/10/2022   Impingement syndrome of right shoulder 04/05/2020   Metatarsalgia of right foot 11/21/2017   Patellar tendinitis of both knees 11/21/2017   Pes anserine bursitis 08/23/2016   Arthralgia of right acromioclavicular joint 08/23/2016   Left Achilles tendinitis 01/18/2016   Medial epicondylitis of right elbow 07/18/2015   OSA on CPAP 03/03/2014   Pain in joint, shoulder region 06/03/2013   Left knee pain 05/19/2013   Sleep apnea with use of continuous positive airway pressure (CPAP) 02/26/2013   Obstructive apnea    Perineum pain, male 10/02/2011   HYPERLIPIDEMIA 01/27/2009   Peptic ulcer 01/27/2009   BACK PAIN, LUMBAR 01/27/2009    PCP: Creola Corn, MD  REFERRING PROVIDER: Ivor Messier, MD  REFERRING DIAG: Pain in joint of left shoulder [M25.512]   THERAPY DIAG:  Chronic left shoulder pain  Stiffness of left shoulder, not elsewhere classified  Rationale for Evaluation and Treatment: Rehabilitation  ONSET DATE: 08/28/2023 date of referral   SUBJECTIVE:                                                                                                                                                                                       SUBJECTIVE STATEMENT: Patient reports to PT d/t L shoulder pain that began about 4-6 months ago. Patient reports that he exercises very regularly and started noticing some left shoulder pain. He has previously experienced a similar symptoms of R Shoulder. He was given some RTC exercises about 4 weeks ago by his provider and is working on these.   Hand dominance: Ambidextrous (Left Handed for writing and fine motor tasks)   PERTINENT HISTORY: Relevant PMHx includes  OSA, R shoulder impingement, Medial Epicondylitis of right elbow   PAIN:  Are you having pain? Yes: NPRS scale: 1/10 current, 10/10 worst  Pain location: anterior left shoulder pain  Pain description: sharp Aggravating factors: stretching too far, reaching overhead, reaching back, putting on my jacket, reaching out to the side Relieving factors: resting  PRECAUTIONS: None  RED FLAGS: None   WEIGHT BEARING RESTRICTIONS: No  FALLS:  Has patient fallen in last 6 months? No  LIVING ENVIRONMENT: Lives with: lives with their spouse Lives in: House/apartment Stairs: Yes  OCCUPATION: Retired  PLOF: Independent  PATIENT GOALS: "I would like to be able to do a plank or push ups."   NEXT MD VISIT:   OBJECTIVE:  Note: Objective measures were completed at Evaluation unless otherwise noted.  DIAGNOSIC FINDINGS:  07/23/24 [place] - Left Shoulder   Summary: Partial tearing of the distal supraspinatus, infraspinatus, and subscapularis   PATIENT SURVEYS:  FOTO 60 current, 73 predicted   COGNITION: Overall cognitive status: Within functional limits for tasks assessed     SENSATION: Not tested  POSTURE: Mild forward head.   UPPER EXTREMITY ROM:   Active ROM Right eval Left eval  Shoulder flexion 140 118  Shoulder extension    Shoulder abduction  125 85  Shoulder adduction    Shoulder internal rotation  74  Shoulder external rotation  45  Elbow flexion    Elbow extension    Wrist flexion    Wrist extension    Wrist ulnar deviation    Wrist radial deviation    Wrist pronation    Wrist supination    (Blank rows = not tested)  UPPER EXTREMITY MMT:  MMT Right eval Left eval  Shoulder flexion    Shoulder extension    Shoulder abduction    Shoulder adduction    Shoulder internal rotation    Shoulder external rotation    Middle trapezius    Lower trapezius    Elbow flexion    Elbow extension    Wrist flexion    Wrist extension    Wrist ulnar deviation    Wrist radial deviation    Wrist pronation    Wrist supination    Grip strength (lbs)    (Blank rows = not tested)                                                                                                                              TREATMENT DATE:   [facility] Adult PT Treatment:                                                DATE: 09/11/2023   Initial evaluation: see patient education and home exercise program as noted below     PATIENT EDUCATION: Education details: reviewed initial home exercise program; discussion  of POC, prognosis and goals for skilled PT  Person educated: Patient Education method: Explanation, Demonstration, and Handouts Education comprehension: verbalized understanding, returned demonstration, and needs further education  HOME EXERCISE PROGRAM: Access Code: [email or web address] URL: [email or web address] Date: 09/11/2023 Prepared by: Mauri Reading  Exercises - Isometric Shoulder Abduction at Wall  - 1 x daily - 7 x weekly - 1 sets - 10 reps - 10 sec hold - Standing Isometric Shoulder Flexion with Doorway - Arm Bent  - 1 x daily - 7 x weekly - 1 sets - 10 reps - 10 sec hold - Standing Isometric Shoulder Extension with Doorway - Arm Bent  - 1 x daily - 7 x weekly - 1 sets - 10 reps - 10 sec hold - Standing Isometric Shoulder  Internal Rotation at Doorway  - 1 x daily - 7 x weekly - 1 sets - 10 reps - 10 sec hold  ASSESSMENT:  CLINICAL IMPRESSION: Jacob Lozano is a 68 y.o. male who was seen today for physical therapy evaluation and treatment for left shoulder pain with mobility deficits, related to tears of infraspinatus, supraspinatus, subscapularis. He has related pain and difficulty with heavy lifting, overhead reaching, and participation in normal exercise including push-ups. He requires skilled PT services at this time to address relevant deficits and improve overall function.     OBJECTIVE IMPAIRMENTS: decreased activity tolerance, decreased ROM, decreased strength, impaired UE functional use, and pain.   ACTIVITY LIMITATIONS: carrying, lifting, sleeping, and reach over head  PARTICIPATION LIMITATIONS: meal prep, cleaning, laundry, and community activity  PERSONAL FACTORS: Past/current experiences and Time since onset of injury/illness/exacerbation are also affecting patient's functional outcome.   REHAB POTENTIAL: Good  CLINICAL DECISION MAKING: Stable/uncomplicated  EVALUATION COMPLEXITY: Low   GOALS: Goals reviewed with patient? Yes  SHORT TERM GOALS: Target date: 10/02/2023   Patient will be independent with initial home program for left shoulder AROM/AAROM, isometric strengthening.  Baseline: provided at eval  Goal status: INITIAL  2.  Patient will demonstrate improved left shoulder flexion and abduction AROM by at least 15 degrees each Baseline: see objective measures  Goal status: INITIAL   LONG TERM GOALS: Target date: 10/23/2023  Patient will report improved overall functional ability with FOTO score of 70 or greater Baseline: 60 Goal status: INITIAL  2.  Patient will demonstrate ability to perform overhead lifting of at least 10# using appropriate body mechanics and with no more than minimal pain in order to safely perform normal daily/occupational tasks.   Baseline: Unable due to pain  and mobility deficits Goal status: INITIAL  3. Patient will demonstrate ability to perform floor to waist lifting of at least 25# using appropriate body mechanics and with no more than minimal pain in order to safely perform normal daily/occupational tasks.   Baseline: Unable due to pain Goal status: INITIAL  4.  Patient will demonstrate ability to hold standard plank for at least 30 seconds without exacerbation of left shoulder pain Baseline: Unable due to pain Goal status: INITIAL  5.  Patient will demonstrate ability to perform 10 push-ups with proper body mechanics and no more than 3/10 left shoulder pain. Baseline: Unable due to pain Goal status: INITIAL   PLAN:  PT FREQUENCY: 1x/week  PT DURATION: 6 weeks  PLANNED INTERVENTIONS: 97164- PT Re-evaluation, 97110-Therapeutic exercises, 97530- Therapeutic activity, [id number]- Neuromuscular re-education, 97535- Self Care, 54098- Manual therapy, 97014- Electrical stimulation (unattended), [id number]- Electrical stimulation (manual), Patient/Family education, Taping, Dry Needling, Joint mobilization, Cryotherapy, and Moist heat  PLAN FOR NEXT  SESSION: shoulder AAROM, manual therapy/modalities as indicated, shoulder isometric strengthening, gradual progression of strengthening exercises to improve end range of motion; patient education as indicated   Mauri Reading, PT, DPT  09/14/2023 2:24 PM

## 2023-09-17 ENCOUNTER — Telehealth: Payer: Self-pay

## 2023-09-17 NOTE — Telephone Encounter (Signed)
Called pt and asked him to bring his CPAP machine with him to his appointment on tomorrow 09/17/2023

## 2023-09-18 ENCOUNTER — Ambulatory Visit (INDEPENDENT_AMBULATORY_CARE_PROVIDER_SITE_OTHER): Payer: Medicare Other | Admitting: Family Medicine

## 2023-09-18 ENCOUNTER — Encounter: Payer: Self-pay | Admitting: Family Medicine

## 2023-09-18 VITALS — BP 118/70 | Ht 73.0 in | Wt 178.6 lb

## 2023-09-18 DIAGNOSIS — G4733 Obstructive sleep apnea (adult) (pediatric): Secondary | ICD-10-CM

## 2023-09-18 NOTE — Patient Instructions (Signed)

## 2023-09-18 NOTE — Progress Notes (Signed)
Jacob Lozano
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
## 2023-09-19 ENCOUNTER — Telehealth: Payer: Self-pay

## 2023-09-19 ENCOUNTER — Ambulatory Visit: Payer: Medicare Other

## 2023-09-19 DIAGNOSIS — M25512 Pain in left shoulder: Secondary | ICD-10-CM | POA: Diagnosis not present

## 2023-09-19 DIAGNOSIS — G8929 Other chronic pain: Secondary | ICD-10-CM

## 2023-09-19 DIAGNOSIS — M25612 Stiffness of left shoulder, not elsewhere classified: Secondary | ICD-10-CM

## 2023-09-19 NOTE — Therapy (Signed)
OUTPATIENT PHYSICAL THERAPY NOTE   Patient Name: Jacob Lozano MRN: [id number] DOB:06-Jan-1956, 68 y.o., male Today's Date: 09/19/2023  END OF SESSION:   PT End of Session - 09/19/23 1221     Visit Number 2    Number of Visits 7    Date for PT Re-Evaluation 10/23/23    Authorization Type MCR/BCBS    PT Start Time 1217    PT Stop Time 1257    PT Time Calculation (min) 40 min    Activity Tolerance Patient tolerated treatment well    Behavior During Therapy [facility] for tasks assessed/performed               Past Medical History:  Diagnosis Date   Cancer (HCC) 2012   basal cell ca on nose   Hernia    Hyperlipidemia    Obstructive apnea    CPAP    Ulcer    Past Surgical History:  Procedure Laterality Date   APPENDECTOMY  2010   HERNIA REPAIR  03/22/2011   Lap bilateral obturator & femoral hernia repairs   Patient Active Problem List   Diagnosis Date Noted   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 07/24/2023   Tibialis posterior tendinopathy 08/02/2022   Dry eye syndrome of both eyes 07/10/2022   Impingement syndrome of right shoulder 04/05/2020   Metatarsalgia of right foot 11/21/2017   Patellar tendinitis of both knees 11/21/2017   Pes anserine bursitis 08/23/2016   Arthralgia of right acromioclavicular joint 08/23/2016   Left Achilles tendinitis 01/18/2016   Medial epicondylitis of right elbow 07/18/2015   OSA on CPAP 03/03/2014   Pain in joint, shoulder region 06/03/2013   Left knee pain 05/19/2013   Sleep apnea with use of continuous positive airway pressure (CPAP) 02/26/2013   Obstructive apnea    Perineum pain, male 10/02/2011   HYPERLIPIDEMIA 01/27/2009   Peptic ulcer 01/27/2009   BACK PAIN, LUMBAR 01/27/2009    PCP: Creola Corn, MD  REFERRING PROVIDER: Ivor Messier, MD  REFERRING DIAG: Pain in joint of left shoulder [M25.512]   THERAPY DIAG:  Chronic left shoulder pain  Stiffness of left shoulder, not elsewhere  classified  Rationale for Evaluation and Treatment: Rehabilitation  ONSET DATE: 08/28/2023 date of referral   SUBJECTIVE:                                                                                                                                                                                      SUBJECTIVE STATEMENT: Patient reports to PT d/t L shoulder pain that began about 4-6 months ago. Patient reports that he exercises very regularly and started noticing some left shoulder  pain. He has previously experienced a similar symptoms of R Shoulder. He was given some RTC exercises about 4 weeks ago by his provider and is working on these.   Hand dominance: Ambidextrous (Left Handed for writing and fine motor tasks)   PERTINENT HISTORY: Relevant PMHx includes OSA, R shoulder impingement, Medial Epicondylitis of right elbow   PAIN:  Are you having pain? Yes: NPRS scale: 1/10 current, 10/10 worst  Pain location: anterior left shoulder pain  Pain description: sharp Aggravating factors: stretching too far, reaching overhead, reaching back, putting on my jacket, reaching out to the side Relieving factors: resting  PRECAUTIONS: None  RED FLAGS: None   WEIGHT BEARING RESTRICTIONS: No  FALLS:  Has patient fallen in last 6 months? No  LIVING ENVIRONMENT: Lives with: lives with their spouse Lives in: House/apartment Stairs: Yes  OCCUPATION: Retired  PLOF: Independent  PATIENT GOALS: "I would like to be able to do a plank or push ups."   NEXT MD VISIT:   OBJECTIVE:  Note: Objective measures were completed at Evaluation unless otherwise noted.  DIAGNOSIC FINDINGS:  07/23/24 [place] - Left Shoulder   Summary: Partial tearing of the distal supraspinatus, infraspinatus, and subscapularis   PATIENT SURVEYS:  FOTO 60 current, 73 predicted   COGNITION: Overall cognitive status: Within functional limits for tasks assessed     SENSATION: Not tested  POSTURE: Mild forward head.    UPPER EXTREMITY ROM:   Active ROM Right eval Left eval  Shoulder flexion 140 118  Shoulder extension    Shoulder abduction 125 85  Shoulder adduction    Shoulder internal rotation  74  Shoulder external rotation  45  Elbow flexion    Elbow extension    Wrist flexion    Wrist extension    Wrist ulnar deviation    Wrist radial deviation    Wrist pronation    Wrist supination    (Blank rows = not tested)  UPPER EXTREMITY MMT:  MMT Right eval Left eval  Shoulder flexion    Shoulder extension    Shoulder abduction    Shoulder adduction    Shoulder internal rotation    Shoulder external rotation    Middle trapezius    Lower trapezius    Elbow flexion    Elbow extension    Wrist flexion    Wrist extension    Wrist ulnar deviation    Wrist radial deviation    Wrist pronation    Wrist supination    Grip strength (lbs)    (Blank rows = not tested)                                                                                                                              TREATMENT DATE:   [facility] Adult PT Treatment:  DATE: 09/19/2023  Therapeutic Exercise: UBE, 2 min fwd/2 min back  S/L ER 2#, 2 x 10 S/L abduction 2#, 2 x 10  S/L flexion 2#, 2 x 10  Supine shoulder flexion to 90 degrees with varied light-to-medium resistance, 3 x 30 sec  PNF D2 flexion 2 x 10 light-to-medium resistance  Standing Rows, 2 x 10 RTB  Standing extension, 2 x 10 RTB   [facility] Adult PT Treatment:                                                DATE: 09/11/2023   Initial evaluation: see patient education and home exercise program as noted below     PATIENT EDUCATION: Education details: reviewed initial home exercise program; discussion of POC, prognosis and goals for skilled PT  Person educated: Patient Education method: Explanation, Demonstration, and Handouts Education comprehension: verbalized understanding, returned demonstration, and  needs further education  HOME EXERCISE PROGRAM: Access Code: [email or web address] URL: [email or web address] Date: 09/11/2023 Prepared by: Mauri Reading  Exercises - Isometric Shoulder Abduction at Wall  - 1 x daily - 7 x weekly - 1 sets - 10 reps - 10 sec hold - Standing Isometric Shoulder Flexion with Doorway - Arm Bent  - 1 x daily - 7 x weekly - 1 sets - 10 reps - 10 sec hold - Standing Isometric Shoulder Extension with Doorway - Arm Bent  - 1 x daily - 7 x weekly - 1 sets - 10 reps - 10 sec hold - Standing Isometric Shoulder Internal Rotation at Doorway  - 1 x daily - 7 x weekly - 1 sets - 10 reps - 10 sec hold  ASSESSMENT:  CLINICAL IMPRESSION:  09/19/2023 Patient was able to tolerate initial treatment session well. He has most pain and difficulty with resisted shoulder external rotation activities. We will reassess response to treatment, including soreness and pain and progress as tolerated at next visit.   EVAL: Jacob Lozano is a 68 y.o. male who was seen today for physical therapy evaluation and treatment for left shoulder pain with mobility deficits, related to tears of infraspinatus, supraspinatus, subscapularis. He has related pain and difficulty with heavy lifting, overhead reaching, and participation in normal exercise including push-ups. He requires skilled PT services at this time to address relevant deficits and improve overall function.     OBJECTIVE IMPAIRMENTS: decreased activity tolerance, decreased ROM, decreased strength, impaired UE functional use, and pain.   ACTIVITY LIMITATIONS: carrying, lifting, sleeping, and reach over head  PARTICIPATION LIMITATIONS: meal prep, cleaning, laundry, and community activity  PERSONAL FACTORS: Past/current experiences and Time since onset of injury/illness/exacerbation are also affecting patient's functional outcome.   REHAB POTENTIAL: Good  CLINICAL DECISION MAKING: Stable/uncomplicated  EVALUATION COMPLEXITY:  Low   GOALS: Goals reviewed with patient? Yes  SHORT TERM GOALS: Target date: 10/02/2023   Patient will be independent with initial home program for left shoulder AROM/AAROM, isometric strengthening.  Baseline: provided at eval  Goal status: INITIAL  2.  Patient will demonstrate improved left shoulder flexion and abduction AROM by at least 15 degrees each Baseline: see objective measures  Goal status: INITIAL   LONG TERM GOALS: Target date: 10/23/2023  Patient will report improved overall functional ability with FOTO score of 70 or greater Baseline: 60 Goal status: INITIAL  2.  Patient will demonstrate ability to perform overhead lifting of at  least 10# using appropriate body mechanics and with no more than minimal pain in order to safely perform normal daily/occupational tasks.   Baseline: Unable due to pain and mobility deficits Goal status: INITIAL  3. Patient will demonstrate ability to perform floor to waist lifting of at least 25# using appropriate body mechanics and with no more than minimal pain in order to safely perform normal daily/occupational tasks.   Baseline: Unable due to pain Goal status: INITIAL  4.  Patient will demonstrate ability to hold standard plank for at least 30 seconds without exacerbation of left shoulder pain Baseline: Unable due to pain Goal status: INITIAL  5.  Patient will demonstrate ability to perform 10 push-ups with proper body mechanics and no more than 3/10 left shoulder pain. Baseline: Unable due to pain Goal status: INITIAL   PLAN:  PT FREQUENCY: 1x/week  PT DURATION: 6 weeks  PLANNED INTERVENTIONS: 97164- PT Re-evaluation, 97110-Therapeutic exercises, 97530- Therapeutic activity, 97112- Neuromuscular re-education, 97535- Self Care, 47425- Manual therapy, 97014- Electrical stimulation (unattended), [phone]- Electrical stimulation (manual), Patient/Family education, Taping, Dry Needling, Joint mobilization, Cryotherapy, and Moist  heat  PLAN FOR NEXT SESSION: shoulder AAROM, manual therapy/modalities as indicated, shoulder isometric strengthening, gradual progression of strengthening exercises to improve end range of motion; patient education as indicated   Mauri Reading, PT, DPT  09/19/2023 4:34 PM

## 2023-09-19 NOTE — Progress Notes (Signed)
Sent [employer] to [employer]

## 2023-09-19 NOTE — Addendum Note (Signed)
Addended by: Arther Abbott on: 09/19/2023 02:34 PM   Modules accepted: Orders

## 2023-09-19 NOTE — Telephone Encounter (Signed)
Sent [employer] to [employer] 09/19/2023

## 2023-09-19 NOTE — Progress Notes (Signed)
Community message sent to [employer] that order placed and Khadijah received message back that they received order.

## 2023-09-25 ENCOUNTER — Ambulatory Visit: Payer: Medicare Other | Admitting: Family Medicine

## 2023-09-25 ENCOUNTER — Ambulatory Visit: Payer: Medicare Other

## 2023-09-27 ENCOUNTER — Ambulatory Visit: Payer: Medicare Other | Attending: Family Medicine

## 2023-09-27 DIAGNOSIS — M25512 Pain in left shoulder: Secondary | ICD-10-CM | POA: Insufficient documentation

## 2023-09-27 DIAGNOSIS — G8929 Other chronic pain: Secondary | ICD-10-CM | POA: Insufficient documentation

## 2023-09-27 DIAGNOSIS — M25612 Stiffness of left shoulder, not elsewhere classified: Secondary | ICD-10-CM | POA: Diagnosis present

## 2023-09-27 NOTE — Therapy (Signed)
 OUTPATIENT PHYSICAL THERAPY NOTE   Patient Name: Jacob Lozano MRN: [id number] DOB:[birth year], 68 y.o., male Today's Date: 09/27/2023  END OF SESSION:   PT End of Session - 09/27/23 0918     Visit Number 3    Number of Visits 7    Date for PT Re-Evaluation 10/23/23    Authorization Type MCR/BCBS    PT Start Time 0917    PT Stop Time 0957    PT Time Calculation (min) 40 min    Activity Tolerance Patient tolerated treatment well    Behavior During Therapy [facility] for tasks assessed/performed                Past Medical History:  Diagnosis Date   Cancer (HCC) 2012   basal cell ca on nose   Hernia    Hyperlipidemia    Obstructive apnea    CPAP    Ulcer    Past Surgical History:  Procedure Laterality Date   APPENDECTOMY  2010   HERNIA REPAIR  03/22/2011   Lap bilateral obturator & femoral hernia repairs   Patient Active Problem List   Diagnosis Date Noted   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 07/24/2023   Tibialis posterior tendinopathy 08/02/2022   Dry eye syndrome of both eyes 07/10/2022   Impingement syndrome of right shoulder 04/05/2020   Metatarsalgia of right foot 11/21/2017   Patellar tendinitis of both knees 11/21/2017   Pes anserine bursitis 08/23/2016   Arthralgia of right acromioclavicular joint 08/23/2016   Left Achilles tendinitis 01/18/2016   Medial epicondylitis of right elbow 07/18/2015   OSA on CPAP 03/03/2014   Pain in joint, shoulder region 06/03/2013   Left knee pain 05/19/2013   Sleep apnea with use of continuous positive airway pressure (CPAP) 02/26/2013   Obstructive apnea    Perineum pain, male 10/02/2011   HYPERLIPIDEMIA 01/27/2009   Peptic ulcer 01/27/2009   BACK PAIN, LUMBAR 01/27/2009    PCP: Onita Rush, MD  REFERRING PROVIDER: Janet Lonni BRAVO, MD  REFERRING DIAG: Pain in joint of left shoulder [M25.512]   THERAPY DIAG:  Chronic left shoulder pain  Stiffness of left shoulder, not elsewhere  classified  Rationale for Evaluation and Treatment: Rehabilitation  ONSET DATE: 08/28/2023 date of referral   SUBJECTIVE:                                                                                                                                                                                      SUBJECTIVE STATEMENT: Patient reports that his shoulder feels somewhat tender today. He has been keeping up with his HEP.   Hand dominance: Ambidextrous (Left Handed for writing  and fine motor tasks)   PERTINENT HISTORY: Relevant PMHx includes OSA, R shoulder impingement, Medial Epicondylitis of right elbow   PAIN:  Are you having pain? Yes: NPRS scale: 1/10 current, 10/10 worst  Pain location: anterior left shoulder pain  Pain description: sharp Aggravating factors: stretching too far, reaching overhead, reaching back, putting on my jacket, reaching out to the side Relieving factors: resting  PRECAUTIONS: None  RED FLAGS: None   WEIGHT BEARING RESTRICTIONS: No  FALLS:  Has patient fallen in last 6 months? No  LIVING ENVIRONMENT: Lives with: lives with their spouse Lives in: House/apartment Stairs: Yes  OCCUPATION: Retired  PLOF: Independent  PATIENT GOALS: I would like to be able to do a plank or push ups.   NEXT MD VISIT:   OBJECTIVE:  Note: Objective measures were completed at Evaluation unless otherwise noted.  DIAGNOSIC FINDINGS:  07/23/24 [place]  - Left Shoulder   Summary: Partial tearing of the distal supraspinatus, infraspinatus, and subscapularis   PATIENT SURVEYS:  FOTO 60 current, 73 predicted   COGNITION: Overall cognitive status: Within functional limits for tasks assessed     SENSATION: Not tested  POSTURE: Mild forward head.   UPPER EXTREMITY ROM:   Active ROM Right eval Left eval  Shoulder flexion 140 118  Shoulder extension    Shoulder abduction 125 85  Shoulder adduction    Shoulder internal rotation  74  Shoulder external  rotation  45  Elbow flexion    Elbow extension    Wrist flexion    Wrist extension    Wrist ulnar deviation    Wrist radial deviation    Wrist pronation    Wrist supination    (Blank rows = not tested)  UPPER EXTREMITY MMT:  MMT Right eval Left eval  Shoulder flexion    Shoulder extension    Shoulder abduction    Shoulder adduction    Shoulder internal rotation    Shoulder external rotation    Middle trapezius    Lower trapezius    Elbow flexion    Elbow extension    Wrist flexion    Wrist extension    Wrist ulnar deviation    Wrist radial deviation    Wrist pronation    Wrist supination    Grip strength (lbs)    (Blank rows = not tested)                                                                                                                              TREATMENT DATE:   [facility] Adult PT Treatment:                                                DATE: 09/27/2023  Therapeutic Exercise: UBE level 2, 2 min fwd/2 min back  S/L ER 2#, 2 x  10 S/L abduction 1#, 3 x 10 S/L flexion 1#, 3 x 10 Standing Rows, 2 x 10 GTB  Standing extension, 2 x 10 GTB Standing D1 extension, 2 x 10 RTB Standing D2 extension, 2 x 10 RTB  Manual Therapy:  STM to anterior/lateral UE mm  Therapeutic Activity:  Patient education regarding response to session, plan for progression of activities; use of thermal modalities to pain modulation Concurrent with cold pack to superior and anterior shoulder at end of session.   [facility] Adult PT Treatment:                                                DATE: 09/19/2023  Therapeutic Exercise: UBE, 2 min fwd/2 min back  S/L ER 2#, 2 x 10 S/L abduction 2#, 2 x 10  S/L flexion 2#, 2 x 10  Supine shoulder flexion to 90 degrees with varied light-to-medium resistance, 3 x 30 sec  PNF D2 flexion 2 x 10 light-to-medium resistance  Standing Rows, 2 x 10 GTB  Standing extension, 2 x 10 RTB   [facility] Adult PT Treatment:                                                 DATE: 09/11/2023   Initial evaluation: see patient education and home exercise program as noted below     PATIENT EDUCATION: Education details: reviewed initial home exercise program; discussion of POC, prognosis and goals for skilled PT  Person educated: Patient Education method: Explanation, Demonstration, and Handouts Education comprehension: verbalized understanding, returned demonstration, and needs further education  HOME EXERCISE PROGRAM: Access Code: [email or web address] URL: [email or web address] Date: 09/11/2023 Prepared by: Marko Molt  Exercises - Isometric Shoulder Abduction at Wall  - 1 x daily - 7 x weekly - 1 sets - 10 reps - 10 sec hold - Standing Isometric Shoulder Flexion with Doorway - Arm Bent  - 1 x daily - 7 x weekly - 1 sets - 10 reps - 10 sec hold - Standing Isometric Shoulder Extension with Doorway - Arm Bent  - 1 x daily - 7 x weekly - 1 sets - 10 reps - 10 sec hold - Standing Isometric Shoulder Internal Rotation at Doorway  - 1 x daily - 7 x weekly - 1 sets - 10 reps - 10 sec hold  ASSESSMENT:  CLINICAL IMPRESSION:  09/27/2023 Patient has been compliant with HEP and is motivated to continue with PT. He was able to tolerate progression of exercises, including increased resistance or repetitions with strengthening activities. He reports decreased pain following manual therapy and cold pack. We will continue to progress as tolerated, including updated HEP at next visit.   EVAL: Jacob Lozano is a 68 y.o. male who was seen today for physical therapy evaluation and treatment for left shoulder pain with mobility deficits, related to tears of infraspinatus, supraspinatus, subscapularis. He has related pain and difficulty with heavy lifting, overhead reaching, and participation in normal exercise including push-ups. He requires skilled PT services at this time to address relevant deficits and improve overall function.     OBJECTIVE IMPAIRMENTS: decreased  activity tolerance, decreased ROM, decreased strength, impaired UE functional use, and pain.   ACTIVITY LIMITATIONS: carrying, lifting, sleeping, and reach  over head  PARTICIPATION LIMITATIONS: meal prep, cleaning, laundry, and community activity  PERSONAL FACTORS: Past/current experiences and Time since onset of injury/illness/exacerbation are also affecting patient's functional outcome.   REHAB POTENTIAL: Good  CLINICAL DECISION MAKING: Stable/uncomplicated  EVALUATION COMPLEXITY: Low   GOALS: Goals reviewed with patient? Yes  SHORT TERM GOALS: Target date: 10/02/2023   Patient will be independent with initial home program for left shoulder AROM/AAROM, isometric strengthening.  Baseline: provided at eval  Goal status: INITIAL  2.  Patient will demonstrate improved left shoulder flexion and abduction AROM by at least 15 degrees each Baseline: see objective measures  Goal status: INITIAL   LONG TERM GOALS: Target date: 10/23/2023  Patient will report improved overall functional ability with FOTO score of 70 or greater Baseline: 60 Goal status: INITIAL  2.  Patient will demonstrate ability to perform overhead lifting of at least 10# using appropriate body mechanics and with no more than minimal pain in order to safely perform normal daily/occupational tasks.   Baseline: Unable due to pain and mobility deficits Goal status: INITIAL  3. Patient will demonstrate ability to perform floor to waist lifting of at least 25# using appropriate body mechanics and with no more than minimal pain in order to safely perform normal daily/occupational tasks.   Baseline: Unable due to pain Goal status: INITIAL  4.  Patient will demonstrate ability to hold standard plank for at least 30 seconds without exacerbation of left shoulder pain Baseline: Unable due to pain Goal status: INITIAL  5.  Patient will demonstrate ability to perform 10 push-ups with proper body mechanics and no more than  3/10 left shoulder pain. Baseline: Unable due to pain Goal status: INITIAL   PLAN:  PT FREQUENCY: 1x/week  PT DURATION: 6 weeks  PLANNED INTERVENTIONS: 97164- PT Re-evaluation, 97110-Therapeutic exercises, 97530- Therapeutic activity, 97112- Neuromuscular re-education, 97535- Self Care, [id number]- Manual therapy, 97014- Electrical stimulation (unattended), [phone]- Electrical stimulation (manual), Patient/Family education, Taping, Dry Needling, Joint mobilization, Cryotherapy, and Moist heat  PLAN FOR NEXT SESSION: shoulder AAROM, manual therapy/modalities as indicated, shoulder isometric strengthening, gradual progression of strengthening exercises to improve end range of motion; patient education as indicated   Marko Molt, PT, DPT  09/27/2023 1:43 PM

## 2023-10-02 ENCOUNTER — Ambulatory Visit: Payer: Medicare Other

## 2023-10-02 DIAGNOSIS — M25512 Pain in left shoulder: Secondary | ICD-10-CM | POA: Diagnosis not present

## 2023-10-02 DIAGNOSIS — G8929 Other chronic pain: Secondary | ICD-10-CM

## 2023-10-02 DIAGNOSIS — M25612 Stiffness of left shoulder, not elsewhere classified: Secondary | ICD-10-CM

## 2023-10-02 NOTE — Therapy (Addendum)
 OUTPATIENT PHYSICAL THERAPY NOTE   Patient Name: Jacob Lozano MRN: [id number] DOB:[birth year], 68 y.o., male Today's Date: 10/02/2023  END OF SESSION:   PT End of Session - 10/02/23 1402     Visit Number 4    Number of Visits 7    Date for PT Re-Evaluation 10/23/23    Authorization Type MCR/BCBS    PT Start Time 1400    PT Stop Time 1438    PT Time Calculation (min) 38 min    Activity Tolerance Patient tolerated treatment well    Behavior During Therapy WFL for tasks assessed/performed                 Past Medical History:  Diagnosis Date   Cancer (HCC) 2012   basal cell ca on nose   Hernia    Hyperlipidemia    Obstructive apnea    CPAP    Ulcer    Past Surgical History:  Procedure Laterality Date   APPENDECTOMY  2010   HERNIA REPAIR  03/22/2011   Lap bilateral obturator & femoral hernia repairs   Patient Active Problem List   Diagnosis Date Noted   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 07/24/2023   Tibialis posterior tendinopathy 08/02/2022   Dry eye syndrome of both eyes 07/10/2022   Impingement syndrome of right shoulder 04/05/2020   Metatarsalgia of right foot 11/21/2017   Patellar tendinitis of both knees 11/21/2017   Pes anserine bursitis 08/23/2016   Arthralgia of right acromioclavicular joint 08/23/2016   Left Achilles tendinitis 01/18/2016   Medial epicondylitis of right elbow 07/18/2015   OSA on CPAP 03/03/2014   Pain in joint, shoulder region 06/03/2013   Left knee pain 05/19/2013   Sleep apnea with use of continuous positive airway pressure (CPAP) 02/26/2013   Obstructive apnea    Perineum pain, male 10/02/2011   HYPERLIPIDEMIA 01/27/2009   Peptic ulcer 01/27/2009   BACK PAIN, LUMBAR 01/27/2009    PCP: Creola Corn, MD  REFERRING PROVIDER: Ivor Messier, MD  REFERRING DIAG: Pain in joint of left shoulder [M25.512]   THERAPY DIAG:  Chronic left shoulder pain  Stiffness of left shoulder, not elsewhere  classified  Rationale for Evaluation and Treatment: Rehabilitation  ONSET DATE: 08/28/2023 date of referral   SUBJECTIVE:                                                                                                                                                                                      SUBJECTIVE STATEMENT: Patient reports that he has some burning in the front of his shoulder today.   Hand dominance: Ambidextrous (Left Handed for writing and fine  motor tasks)   PERTINENT HISTORY: Relevant PMHx includes OSA, R shoulder impingement, Medial Epicondylitis of right elbow   PAIN:  Are you having pain? Yes: NPRS scale: 1/10 current, 10/10 worst  Pain location: anterior left shoulder pain  Pain description: sharp Aggravating factors: stretching too far, reaching overhead, reaching back, putting on my jacket, reaching out to the side Relieving factors: resting  PRECAUTIONS: None  RED FLAGS: None   WEIGHT BEARING RESTRICTIONS: No  FALLS:  Has patient fallen in last 6 months? No  LIVING ENVIRONMENT: Lives with: lives with their spouse Lives in: House/apartment Stairs: Yes  OCCUPATION: Retired  PLOF: Independent  PATIENT GOALS: "I would like to be able to do a plank or push ups."   NEXT MD VISIT:   OBJECTIVE:  Note: Objective measures were completed at Evaluation unless otherwise noted.  DIAGNOSIC FINDINGS:  07/23/24 [place] - Left Shoulder   Summary: Partial tearing of the distal supraspinatus, infraspinatus, and subscapularis   PATIENT SURVEYS:  FOTO 60 current, 73 predicted   COGNITION: Overall cognitive status: Within functional limits for tasks assessed     SENSATION: Not tested  POSTURE: Mild forward head.   UPPER EXTREMITY ROM:   Active ROM Right eval Left eval  Shoulder flexion 140 118  Shoulder extension    Shoulder abduction 125 85  Shoulder adduction    Shoulder internal rotation  74  Shoulder external rotation  45  Elbow flexion     Elbow extension    Wrist flexion    Wrist extension    Wrist ulnar deviation    Wrist radial deviation    Wrist pronation    Wrist supination    (Blank rows = not tested)  UPPER EXTREMITY MMT:  MMT Right eval Left eval  Shoulder flexion    Shoulder extension    Shoulder abduction    Shoulder adduction    Shoulder internal rotation    Shoulder external rotation    Middle trapezius    Lower trapezius    Elbow flexion    Elbow extension    Wrist flexion    Wrist extension    Wrist ulnar deviation    Wrist radial deviation    Wrist pronation    Wrist supination    Grip strength (lbs)    (Blank rows = not tested)                                                                                                                              TREATMENT DATE:   [facility] Adult PT Treatment:                                                DATE: 10/02/2023  Therapeutic Exercise: UBE level 2, 3 min fwd/3 min back  S/L ER 2#, 2 x 10 S/L  abduction 1#, 3 x 10 S/L flexion 1#, 3 x 10 Standing Rows, 2 x 10 GTB  Standing extension, 2 x 10 GTB Standing D1 extension, 2 x 10 RTB Standing D2 extension, 2 x 10 RTB Supine SA punch 2 x 10 unresisted  Standing RTC stabilization with perturbations (pball at 90 deg flexion), 3 x 20 sec  Counter to shelf lifting 2# DB, 4 x 5 to 2nd and 3rd shelf.     [facility] Adult PT Treatment:                                                DATE: 09/27/2023  Therapeutic Exercise: UBE level 2, 2 min fwd/2 min back  S/L ER 2#, 2 x 10 S/L abduction 1#, 3 x 10 S/L flexion 1#, 3 x 10 Standing Rows, 2 x 10 GTB  Standing extension, 2 x 10 GTB Standing D1 extension, 2 x 10 RTB Standing D2 extension, 2 x 10 RTB  Manual Therapy:  STM to anterior/lateral UE mm  Therapeutic Activity:  Patient education regarding response to session, plan for progression of activities; use of thermal modalities to pain modulation Concurrent with cold pack to superior and anterior  shoulder at end of session.   [facility] Adult PT Treatment:                                                DATE: 09/19/2023  Therapeutic Exercise: UBE, 2 min fwd/2 min back  S/L ER 2#, 2 x 10 S/L abduction 2#, 2 x 10  S/L flexion 2#, 2 x 10  Supine shoulder flexion to 90 degrees with varied light-to-medium resistance, 3 x 30 sec  PNF D2 flexion 2 x 10 light-to-medium resistance  Standing Rows, 2 x 10 GTB  Standing extension, 2 x 10 RTB   [facility] Adult PT Treatment:                                                DATE: 09/11/2023   Initial evaluation: see patient education and home exercise program as noted below     PATIENT EDUCATION: Education details: reviewed initial home exercise program; discussion of POC, prognosis and goals for skilled PT  Person educated: Patient Education method: Explanation, Demonstration, and Handouts Education comprehension: verbalized understanding, returned demonstration, and needs further education  HOME EXERCISE PROGRAM: Access Code: [email or web address] URL: [email or web address] Date: 09/11/2023 Prepared by: Mauri Reading  Exercises - Isometric Shoulder Abduction at Wall  - 1 x daily - 7 x weekly - 1 sets - 10 reps - 10 sec hold - Standing Isometric Shoulder Flexion with Doorway - Arm Bent  - 1 x daily - 7 x weekly - 1 sets - 10 reps - 10 sec hold - Standing Isometric Shoulder Extension with Doorway - Arm Bent  - 1 x daily - 7 x weekly - 1 sets - 10 reps - 10 sec hold - Standing Isometric Shoulder Internal Rotation at Doorway  - 1 x daily - 7 x weekly - 1 sets - 10 reps - 10 sec hold  ASSESSMENT:  CLINICAL IMPRESSION:  10/02/2023 Patient continues to be motivated for participation in and progression with skilled PT. He benefited from additional periscapular strengthening and stabilization exercises today in order to decrease pain with active shoulder elevation. We will continue to progress as appropriate.   EVAL: Jacob Lozano is a 68 y.o. male who was  seen today for physical therapy evaluation and treatment for left shoulder pain with mobility deficits, related to tears of infraspinatus, supraspinatus, subscapularis. He has related pain and difficulty with heavy lifting, overhead reaching, and participation in normal exercise including push-ups. He requires skilled PT services at this time to address relevant deficits and improve overall function.     OBJECTIVE IMPAIRMENTS: decreased activity tolerance, decreased ROM, decreased strength, impaired UE functional use, and pain.   ACTIVITY LIMITATIONS: carrying, lifting, sleeping, and reach over head  PARTICIPATION LIMITATIONS: meal prep, cleaning, laundry, and community activity  PERSONAL FACTORS: Past/current experiences and Time since onset of injury/illness/exacerbation are also affecting patient's functional outcome.   REHAB POTENTIAL: Good  CLINICAL DECISION MAKING: Stable/uncomplicated  EVALUATION COMPLEXITY: Low   GOALS: Goals reviewed with patient? Yes  SHORT TERM GOALS: Target date: 10/02/2023   Patient will be independent with initial home program for left shoulder AROM/AAROM, isometric strengthening.  Baseline: provided at eval  Goal status: INITIAL  2.  Patient will demonstrate improved left shoulder flexion and abduction AROM by at least 15 degrees each Baseline: see objective measures  Goal status: INITIAL   LONG TERM GOALS: Target date: 10/23/2023  Patient will report improved overall functional ability with FOTO score of 70 or greater Baseline: 60 Goal status: INITIAL  2.  Patient will demonstrate ability to perform overhead lifting of at least 10# using appropriate body mechanics and with no more than minimal pain in order to safely perform normal daily/occupational tasks.   Baseline: Unable due to pain and mobility deficits Goal status: INITIAL  3. Patient will demonstrate ability to perform floor to waist lifting of at least 25# using appropriate body  mechanics and with no more than minimal pain in order to safely perform normal daily/occupational tasks.   Baseline: Unable due to pain Goal status: INITIAL  4.  Patient will demonstrate ability to hold standard plank for at least 30 seconds without exacerbation of left shoulder pain Baseline: Unable due to pain Goal status: INITIAL  5.  Patient will demonstrate ability to perform 10 push-ups with proper body mechanics and no more than 3/10 left shoulder pain. Baseline: Unable due to pain Goal status: INITIAL   PLAN:  PT FREQUENCY: 1x/week  PT DURATION: 6 weeks  PLANNED INTERVENTIONS: 97164- PT Re-evaluation, 97110-Therapeutic exercises, 97530- Therapeutic activity, 97112- Neuromuscular re-education, 97535- Self Care, 16109- Manual therapy, 97014- Electrical stimulation (unattended), [id number]- Electrical stimulation (manual), Patient/Family education, Taping, Dry Needling, Joint mobilization, Cryotherapy, and Moist heat  PLAN FOR NEXT SESSION: shoulder AAROM, manual therapy/modalities as indicated, shoulder isometric strengthening, gradual progression of strengthening exercises to improve end range of motion; patient education as indicated   Mauri Reading, PT, DPT  10/02/2023 2:51 PM

## 2023-10-02 NOTE — Telephone Encounter (Signed)
Faxed CPAP Supply order to [facility] [phone]

## 2023-10-09 ENCOUNTER — Ambulatory Visit: Payer: Medicare Other

## 2023-10-15 ENCOUNTER — Ambulatory Visit: Payer: Medicare Other | Admitting: Sports Medicine

## 2023-10-16 ENCOUNTER — Ambulatory Visit: Payer: Medicare Other

## 2023-10-16 DIAGNOSIS — G8929 Other chronic pain: Secondary | ICD-10-CM

## 2023-10-16 DIAGNOSIS — M25512 Pain in left shoulder: Secondary | ICD-10-CM | POA: Diagnosis not present

## 2023-10-16 DIAGNOSIS — M25612 Stiffness of left shoulder, not elsewhere classified: Secondary | ICD-10-CM

## 2023-10-16 NOTE — Therapy (Signed)
 OUTPATIENT PHYSICAL THERAPY NOTE   Patient Name: Jacob Lozano MRN: [id number] DOB:Apr 22, 1956, 68 y.o., male Today's Date: 10/16/2023  END OF SESSION:   PT End of Session - 10/16/23 1406     Visit Number 5    Number of Visits 7    Date for PT Re-Evaluation 10/23/23    Authorization Type MCR/BCBS    PT Start Time 1401    PT Stop Time 1439    PT Time Calculation (min) 38 min    Activity Tolerance Patient tolerated treatment well    Behavior During Therapy WFL for tasks assessed/performed                  Past Medical History:  Diagnosis Date   Cancer (HCC) 2012   basal cell ca on nose   Hernia    Hyperlipidemia    Obstructive apnea    CPAP    Ulcer    Past Surgical History:  Procedure Laterality Date   APPENDECTOMY  2010   HERNIA REPAIR  03/22/2011   Lap bilateral obturator & femoral hernia repairs   Patient Active Problem List   Diagnosis Date Noted   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 07/24/2023   Tibialis posterior tendinopathy 08/02/2022   Dry eye syndrome of both eyes 07/10/2022   Impingement syndrome of right shoulder 04/05/2020   Metatarsalgia of right foot 11/21/2017   Patellar tendinitis of both knees 11/21/2017   Pes anserine bursitis 08/23/2016   Arthralgia of right acromioclavicular joint 08/23/2016   Left Achilles tendinitis 01/18/2016   Medial epicondylitis of right elbow 07/18/2015   OSA on CPAP 03/03/2014   Pain in joint, shoulder region 06/03/2013   Left knee pain 05/19/2013   Sleep apnea with use of continuous positive airway pressure (CPAP) 02/26/2013   Obstructive apnea    Perineum pain, male 10/02/2011   HYPERLIPIDEMIA 01/27/2009   Peptic ulcer 01/27/2009   BACK PAIN, LUMBAR 01/27/2009    PCP: Creola Corn, MD  REFERRING PROVIDER: Ivor Messier, MD  REFERRING DIAG: Pain in joint of left shoulder [M25.512]   THERAPY DIAG:  Chronic left shoulder pain  Stiffness of left shoulder, not elsewhere  classified  Rationale for Evaluation and Treatment: Rehabilitation  ONSET DATE: 08/28/2023 date of referral   SUBJECTIVE:                                                                                                                                                                                      SUBJECTIVE STATEMENT: Patient reports that he had some pain for about 2 days after last visit. However, it has resolved and he continues to be compliant  with HEP.   Hand dominance: Ambidextrous (Left Handed for writing and fine motor tasks)   PERTINENT HISTORY: Relevant PMHx includes OSA, R shoulder impingement, Medial Epicondylitis of right elbow   PAIN:  Are you having pain? Yes: NPRS scale: 1/10 current, 10/10 worst  Pain location: anterior left shoulder pain  Pain description: sharp Aggravating factors: stretching too far, reaching overhead, reaching back, putting on my jacket, reaching out to the side Relieving factors: resting  PRECAUTIONS: None  RED FLAGS: None   WEIGHT BEARING RESTRICTIONS: No  FALLS:  Has patient fallen in last 6 months? No  LIVING ENVIRONMENT: Lives with: lives with their spouse Lives in: House/apartment Stairs: Yes  OCCUPATION: Retired  PLOF: Independent  PATIENT GOALS: "I would like to be able to do a plank or push ups."   NEXT MD VISIT:   OBJECTIVE:  Note: Objective measures were completed at Evaluation unless otherwise noted.  DIAGNOSIC FINDINGS:  07/23/24 [place] - Left Shoulder   Summary: Partial tearing of the distal supraspinatus, infraspinatus, and subscapularis   PATIENT SURVEYS:  FOTO 60 current, 73 predicted   COGNITION: Overall cognitive status: Within functional limits for tasks assessed     SENSATION: Not tested  POSTURE: Mild forward head.   UPPER EXTREMITY ROM:   Active ROM Right eval Left eval  Shoulder flexion 140 118  Shoulder extension    Shoulder abduction 125 85  Shoulder adduction    Shoulder internal  rotation  74  Shoulder external rotation  45  Elbow flexion    Elbow extension    Wrist flexion    Wrist extension    Wrist ulnar deviation    Wrist radial deviation    Wrist pronation    Wrist supination    (Blank rows = not tested)  UPPER EXTREMITY MMT:  MMT Right eval Left eval  Shoulder flexion    Shoulder extension    Shoulder abduction    Shoulder adduction    Shoulder internal rotation    Shoulder external rotation    Middle trapezius    Lower trapezius    Elbow flexion    Elbow extension    Wrist flexion    Wrist extension    Wrist ulnar deviation    Wrist radial deviation    Wrist pronation    Wrist supination    Grip strength (lbs)    (Blank rows = not tested)                                                                                                                              TREATMENT DATE:   [facility] Adult PT Treatment:                                                DATE: 10/16/2023  Therapeutic Exercise: UBE level 2,  2 min fwd/2 min back  S/L ER 2#, 2 x 10 S/L abduction 1#, 3 x 10 S/L flexion 1#, 3 x 10 isometric er/ir walkouts RTB, 2 x 5 eac Standing Rows, 2 x 10 GTB  Standing extension, 2 x 10 GTB Standing D1 extension, 2 x 10 RTB Standing D2 extension, 2 x 10 RTB Supine SA punch 2 x 10 1# db Standing RTC stabilization with perturbations (pball at 90 deg flexion), 3 x 20 sec     PATIENT EDUCATION: Education details: reviewed initial home exercise program; discussion of POC, prognosis and goals for skilled PT  Person educated: Patient Education method: Explanation, Demonstration, and Handouts Education comprehension: verbalized understanding, returned demonstration, and needs further education  HOME EXERCISE PROGRAM: Access Code: [email or web address] URL: [email or web address] Date: 09/11/2023 Prepared by: Mauri Reading  Exercises - Isometric Shoulder Abduction at Wall  - 1 x daily - 7 x weekly - 1 sets - 10 reps - 10 sec hold -  Standing Isometric Shoulder Flexion with Doorway - Arm Bent  - 1 x daily - 7 x weekly - 1 sets - 10 reps - 10 sec hold - Standing Isometric Shoulder Extension with Doorway - Arm Bent  - 1 x daily - 7 x weekly - 1 sets - 10 reps - 10 sec hold - Standing Isometric Shoulder Internal Rotation at Doorway  - 1 x daily - 7 x weekly - 1 sets - 10 reps - 10 sec hold  ASSESSMENT:  CLINICAL IMPRESSION:  10/16/2023 Patient continues to be motivated and tolerate progression of exercises well. He continues to have difficulty with shoulder ER strengthening activities. However, we will continue to progress as tolerated. Plan is to perform reassessment of objective measures and overall progress towards rehab goals at next visit.    EVAL: [place] is a 68 y.o. male who was seen today for physical therapy evaluation and treatment for left shoulder pain with mobility deficits, related to tears of infraspinatus, supraspinatus, subscapularis. He has related pain and difficulty with heavy lifting, overhead reaching, and participation in normal exercise including push-ups. He requires skilled PT services at this time to address relevant deficits and improve overall function.     OBJECTIVE IMPAIRMENTS: decreased activity tolerance, decreased ROM, decreased strength, impaired UE functional use, and pain.   ACTIVITY LIMITATIONS: carrying, lifting, sleeping, and reach over head  PARTICIPATION LIMITATIONS: meal prep, cleaning, laundry, and community activity  PERSONAL FACTORS: Past/current experiences and Time since onset of injury/illness/exacerbation are also affecting patient's functional outcome.   REHAB POTENTIAL: Good  CLINICAL DECISION MAKING: Stable/uncomplicated  EVALUATION COMPLEXITY: Low   GOALS: Goals reviewed with patient? Yes  SHORT TERM GOALS: Target date: 10/02/2023   Patient will be independent with initial home program for left shoulder AROM/AAROM, isometric strengthening.  Baseline: provided at  eval  Goal status: INITIAL  2.  Patient will demonstrate improved left shoulder flexion and abduction AROM by at least 15 degrees each Baseline: see objective measures  Goal status: INITIAL   LONG TERM GOALS: Target date: 10/23/2023  Patient will report improved overall functional ability with FOTO score of 70 or greater Baseline: 60 Goal status: INITIAL  2.  Patient will demonstrate ability to perform overhead lifting of at least 10# using appropriate body mechanics and with no more than minimal pain in order to safely perform normal daily/occupational tasks.   Baseline: Unable due to pain and mobility deficits Goal status: INITIAL  3. Patient will demonstrate ability to perform floor to waist lifting of at  least 25# using appropriate body mechanics and with no more than minimal pain in order to safely perform normal daily/occupational tasks.   Baseline: Unable due to pain Goal status: INITIAL  4.  Patient will demonstrate ability to hold standard plank for at least 30 seconds without exacerbation of left shoulder pain Baseline: Unable due to pain Goal status: INITIAL  5.  Patient will demonstrate ability to perform 10 push-ups with proper body mechanics and no more than 3/10 left shoulder pain. Baseline: Unable due to pain Goal status: INITIAL   PLAN:  PT FREQUENCY: 1x/week  PT DURATION: 6 weeks  PLANNED INTERVENTIONS: 97164- PT Re-evaluation, 97110-Therapeutic exercises, 97530- Therapeutic activity, 97112- Neuromuscular re-education, 97535- Self Care, 84696- Manual therapy, 97014- Electrical stimulation (unattended), [phone]- Electrical stimulation (manual), Patient/Family education, Taping, Dry Needling, Joint mobilization, Cryotherapy, and Moist heat  PLAN FOR NEXT SESSION: shoulder AAROM, manual therapy/modalities as indicated, shoulder isometric strengthening, gradual progression of strengthening exercises to improve end range of motion; patient education as  indicated   Mauri Reading, PT, DPT  10/16/2023 2:49 PM

## 2023-10-23 ENCOUNTER — Ambulatory Visit: Payer: Medicare Other | Attending: Family Medicine

## 2023-10-23 DIAGNOSIS — M25612 Stiffness of left shoulder, not elsewhere classified: Secondary | ICD-10-CM | POA: Diagnosis present

## 2023-10-23 DIAGNOSIS — M25512 Pain in left shoulder: Secondary | ICD-10-CM | POA: Insufficient documentation

## 2023-10-23 DIAGNOSIS — G8929 Other chronic pain: Secondary | ICD-10-CM | POA: Diagnosis present

## 2023-10-23 NOTE — Therapy (Addendum)
 OUTPATIENT PHYSICAL THERAPY NOTE   Discharge Summary Visits from Start of Care: 6  Current functional level related to goals / functional outcomes: See assessment   Remaining deficits: See assessment, current status unknown.   Education / Equipment: HEP   Patient agrees to discharge. Patient goals were not met. Patient is being discharged due to not returning since the last visit.  Joneen Fresh PT, DPT, LAT, ATC  06/17/24  9:00 AM      Patient Name: Jacob Lozano MRN: [id number] DOB:[birth year], 68 y.o., male Today's Date: 10/23/2023  END OF SESSION:   PT End of Session - 10/23/23 1359     Visit Number 6    Number of Visits 7    Date for PT Re-Evaluation 10/23/23    Authorization Type MCR/BCBS    PT Start Time 1400    PT Stop Time 1443    PT Time Calculation (min) 43 min    Activity Tolerance Patient tolerated treatment well    Behavior During Therapy WFL for tasks assessed/performed                   Past Medical History:  Diagnosis Date   Cancer (HCC) 2012   basal cell ca on nose   Hernia    Hyperlipidemia    Obstructive apnea    CPAP    Ulcer    Past Surgical History:  Procedure Laterality Date   APPENDECTOMY  2010   HERNIA REPAIR  03/22/2011   Lap bilateral obturator & femoral hernia repairs   Patient Active Problem List   Diagnosis Date Noted   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 07/24/2023   Tibialis posterior tendinopathy 08/02/2022   Dry eye syndrome of both eyes 07/10/2022   Impingement syndrome of right shoulder 04/05/2020   Metatarsalgia of right foot 11/21/2017   Patellar tendinitis of both knees 11/21/2017   Pes anserine bursitis 08/23/2016   Arthralgia of right acromioclavicular joint 08/23/2016   Left Achilles tendinitis 01/18/2016   Medial epicondylitis of right elbow 07/18/2015   OSA on CPAP 03/03/2014   Pain in joint, shoulder region 06/03/2013   Left knee pain 05/19/2013   Sleep apnea with use of  continuous positive airway pressure (CPAP) 02/26/2013   Obstructive apnea    Perineum pain, male 10/02/2011   HYPERLIPIDEMIA 01/27/2009   Peptic ulcer 01/27/2009   BACK PAIN, LUMBAR 01/27/2009    PCP: Onita Rush, MD  REFERRING PROVIDER: Janet Lonni BRAVO, MD  REFERRING DIAG: Pain in joint of left shoulder [M25.512]   THERAPY DIAG:  Chronic left shoulder pain  Stiffness of left shoulder, not elsewhere classified  Rationale for Evaluation and Treatment: Rehabilitation  ONSET DATE: 08/28/2023 date of referral   SUBJECTIVE:  SUBJECTIVE STATEMENT: Patient reports that he has noticed some improvement of pain with daily activities since initial visit. He continues to be compliant with HEP. However, he still feels L shoulder pain with certain activities. Since last visit, he reached back to close a door and then swung his arm forward, resulting in intense bout of pain that resolved after 10 seconds. He wants to pause PT until he meets with Dr. Harvey tomorrow.   Hand dominance: Ambidextrous (Left Handed for writing and fine motor tasks)   PERTINENT HISTORY: Relevant PMHx includes OSA, R shoulder impingement, Medial Epicondylitis of right elbow   PAIN:  Are you having pain? Yes: NPRS scale: 1/10 current, 10/10 worst  Pain location: anterior left shoulder pain  Pain description: sharp Aggravating factors: stretching too far, reaching overhead, reaching back, putting on my jacket, reaching out to the side Relieving factors: resting  PRECAUTIONS: None  RED FLAGS: None   WEIGHT BEARING RESTRICTIONS: No  FALLS:  Has patient fallen in last 6 months? No  LIVING ENVIRONMENT: Lives with: lives with their spouse Lives in: House/apartment Stairs: Yes  OCCUPATION: Retired  PLOF: Independent  PATIENT  GOALS: I would like to be able to do a plank or push ups.   NEXT MD VISIT:   OBJECTIVE:  Note: Objective measures were completed at Evaluation unless otherwise noted.  DIAGNOSIC FINDINGS:  07/23/24 US  - Left Shoulder   Summary: Partial tearing of the distal supraspinatus, infraspinatus, and subscapularis   PATIENT SURVEYS:  FOTO 60 current, 73 predicted   COGNITION: Overall cognitive status: Within functional limits for tasks assessed     SENSATION: Not tested  POSTURE: Mild forward head.   UPPER EXTREMITY ROM:   Active ROM Right eval Left eval Left 10/23/23  Shoulder flexion 140 118 115  Shoulder extension     Shoulder abduction 125 85 88  Shoulder adduction     Shoulder internal rotation  74 80  Shoulder external rotation  45 45  Elbow flexion     Elbow extension     Wrist flexion     Wrist extension     Wrist ulnar deviation     Wrist radial deviation     Wrist pronation     Wrist supination     (Blank rows = not tested)  UPPER EXTREMITY MMT:  MMT Right eval Left eval  Shoulder flexion    Shoulder extension    Shoulder abduction    Shoulder adduction    Shoulder internal rotation    Shoulder external rotation    Middle trapezius    Lower trapezius    Elbow flexion    Elbow extension    Wrist flexion    Wrist extension    Wrist ulnar deviation    Wrist radial deviation    Wrist pronation    Wrist supination    Grip strength (lbs)    (Blank rows = not tested)  TREATMENT DATE:   [facility] Adult PT Treatment:                                                DATE: 10/23/2023  Therapeutic Exercise: UBE level 2, 2 min fwd/2 min back  S/L ER 2#, 2 x 10 S/L abduction 1#, 3 x 10 S/L flexion 1#, 3 x 10 isometric ir walkouts Red TB, 2 x 5  isometric er walkouts green TB, 2 x 5  Standing Rows, 2 x 15 Blue TB  Standing extension, 2  x 15  BlueTB Standing D1 extension, 2 x 10 RTB Standing D2 extension, 2 x 10 RTB Standing RTC stabilization with perturbations (pball at 90 deg flexion), 3 x 20 sec   Therapeutic Activity:  Reassessment of objective measures and subjective assessment regarding progress towards established goals and plan for independence with HEP      PATIENT EDUCATION: Education details: reviewed initial home exercise program; discussion of POC, prognosis and goals for skilled PT  Person educated: Patient Education method: Explanation, Demonstration, and Handouts Education comprehension: verbalized understanding, returned demonstration, and needs further education  HOME EXERCISE PROGRAM: Access Code: [email or web address] URL: [email or web address] Date: 09/11/2023 Prepared by: Marko Molt  Exercises - Isometric Shoulder Abduction at Wall  - 1 x daily - 7 x weekly - 1 sets - 10 reps - 10 sec hold - Standing Isometric Shoulder Flexion with Doorway - Arm Bent  - 1 x daily - 7 x weekly - 1 sets - 10 reps - 10 sec hold - Standing Isometric Shoulder Extension with Doorway - Arm Bent  - 1 x daily - 7 x weekly - 1 sets - 10 reps - 10 sec hold - Standing Isometric Shoulder Internal Rotation at Doorway  - 1 x daily - 7 x weekly - 1 sets - 10 reps - 10 sec hold  ASSESSMENT:  CLINICAL IMPRESSION:  10/23/2023 Patient has attended 5 treatment session since initial evaluation for Left shoulder pain with mobility deficits. At this time there has been minimal-to-no change in L shoulder AROM. He is demonstrating good improvements with L shoulder and periscapular muscle endurance with current exercises. He has met his goal of holding standard plank for 30 seconds. Plan is to hold PT until patient follows up with referring provider tomorrow. Patient should continue to be independent with HEP for maintenance of strength and stabilization capacity.    EVAL: Aerik is a 68 y.o. male who was seen today for physical  therapy evaluation and treatment for left shoulder pain with mobility deficits, related to tears of infraspinatus, supraspinatus, subscapularis. He has related pain and difficulty with heavy lifting, overhead reaching, and participation in normal exercise including push-ups. He requires skilled PT services at this time to address relevant deficits and improve overall function.     OBJECTIVE IMPAIRMENTS: decreased activity tolerance, decreased ROM, decreased strength, impaired UE functional use, and pain.   ACTIVITY LIMITATIONS: carrying, lifting, sleeping, and reach over head  PARTICIPATION LIMITATIONS: meal prep, cleaning, laundry, and community activity  PERSONAL FACTORS: Past/current experiences and Time since onset of injury/illness/exacerbation are also affecting patient's functional outcome.   REHAB POTENTIAL: Good  CLINICAL DECISION MAKING: Stable/uncomplicated  EVALUATION COMPLEXITY: Low   GOALS: Goals reviewed with patient? Yes  SHORT TERM GOALS: Target date: 10/02/2023   Patient will be independent with initial home program for left shoulder  AROM/AAROM, isometric strengthening.  Baseline: provided at eval  Goal status: MET  2.  Patient will demonstrate improved left shoulder flexion and abduction AROM by at least 15 degrees each Baseline: see objective measures  Goal status: ONGOING   LONG TERM GOALS: Target date: 11/20/2023   Patient will report improved overall functional ability with FOTO score of 70 or greater Baseline: 60 10/23/23: 62 Goal status: ONGOING  2.  Patient will demonstrate ability to perform overhead lifting of at least 10# using appropriate body mechanics and with no more than minimal pain in order to safely perform normal daily/occupational tasks.   Baseline: Unable due to pain and mobility deficits Goal status: ONGOING  3. Patient will demonstrate ability to perform floor to waist lifting of at least 25# using appropriate body mechanics and with  no more than minimal pain in order to safely perform normal daily/occupational tasks.   Baseline: Unable due to pain 10/23/23: Patient reports ability to lift about 20# Goal status: PROGRESSING  4.  Patient will demonstrate ability to hold standard plank for at least 30 seconds without exacerbation of left shoulder pain Baseline: Unable due to pain Goal status: MET; reports 3/10 pain  with 30 second plank 10/23/23  5.  Patient will demonstrate ability to perform 10 push-ups with proper body mechanics and no more than 3/10 left shoulder pain. Baseline: Unable due to pain Goal status: DISCONTINUE as of 10/23/23   PLAN:  PT FREQUENCY: 1-2x/week  PT DURATION: 4x/week   PLANNED INTERVENTIONS: 97164- PT Re-evaluation, 97110-Therapeutic exercises, 97530- Therapeutic activity, 97112- Neuromuscular re-education, 97535- Self Care, 02859- Manual therapy, 97014- Electrical stimulation (unattended), [phone]- Electrical stimulation (manual), Patient/Family education, Taping, Dry Needling, Joint mobilization, Cryotherapy, and Moist heat  PLAN FOR NEXT SESSION: hold 30 days, pending further MD assessment; shoulder AAROM, manual therapy/modalities as indicated, shoulder isometric strengthening, gradual progression of strengthening exercises to improve end range of motion; patient education as indicated   Marko Molt, PT, DPT  10/23/2023 4:53 PM

## 2023-10-24 ENCOUNTER — Ambulatory Visit: Payer: Medicare Other | Admitting: Sports Medicine

## 2023-10-24 VITALS — BP 122/78 | Ht 73.0 in | Wt 173.0 lb

## 2023-10-24 DIAGNOSIS — M75102 Unspecified rotator cuff tear or rupture of left shoulder, not specified as traumatic: Secondary | ICD-10-CM | POA: Diagnosis present

## 2023-10-24 DIAGNOSIS — M12812 Other specific arthropathies, not elsewhere classified, left shoulder: Secondary | ICD-10-CM | POA: Diagnosis not present

## 2023-10-24 MED ORDER — METHYLPREDNISOLONE ACETATE 40 MG/ML IJ SUSP
40.0000 mg | Freq: Once | INTRAMUSCULAR | Status: AC
  Administered 2023-10-24: 40 mg via INTRA_ARTICULAR

## 2023-10-24 NOTE — Patient Instructions (Signed)
 Continue your rehab exercises 5-7 days per week, these are excellent.  The topical plant based anti-inflammatory we discussed today is called arnica. We did a cortisone injection as below. Let's follow-up together in 6 weeks.  Today you received an injection with a corticosteroid (aka: cortisone injection). This injection is usually done in response to pain and inflammation. There is some "numbing medicine" (Lidocaine) in the shot, so the injected area may be numb and feel really good for the next couple of hours. The numbing medicine usually wears off in 2-3 hours, and then your pain level may be back to where it was before the injection until the cortisone starts working.    The actually benefit from the steroid injection is usually noticed within 3-5 days, but may take up to 14 days. You may actually experience a small (as in 10%) INCREASE in pain in the first 24 hours---that is common.  Things to watch out for that you should contact us or a health care provider urgently would include: 1. Unusual (as in more than 10%) increase in pain 2. New fever > 101.5 3. New swelling or redness of the injected area. 4. Streaking of red lines around the area injected.  Do not hesitate to call or reach out with any questions or concerns.

## 2023-10-24 NOTE — Assessment & Plan Note (Signed)
 Overall doing alright, though plateau of benefit with HEP and PT. Given component of impingement + testing and bursitis I did recommend a cortisone injection for therapeutic benefit while continuing to rehab the shoulder. He was amenable to this. Can ice and use topical arnica as well. Will follow-up in 6 weeks.

## 2023-10-24 NOTE — Progress Notes (Addendum)
   PCP: Creola Corn, MD  SUBJECTIVE:   HPI:  Patient is a 68 y.o. male here for f/u on left shoulder.  Had been following with Dr. Webb Silversmith for left rotator cuff partial thickness tears, initially diagnosed in 07/2023 though had been having pain for a few months prior to that eval. Has been trying to treat this holistically really focusing on HEP and PT. Endorses he has had a plateau without much improvement over the past 6 weeks. Does feel his strength is improved, but still limited in ROM and pain. +pain with ER and reaching back for things. Has not taken any medications orally or topically for this. Strongly reinforces wishes to avoid surgery.  Pertinent ROS were reviewed with the patient and found to be negative unless otherwise specified above in HPI.   PERTINENT  PMH / PSH / FH / SH:  Past Medical, Surgical, Social, and Family History Reviewed & Updated in the EMR.  Pertinent findings include:  Non-contributory, no history of diabetes  No Known Allergies  OBJECTIVE:  BP 122/78   Ht 6\' 1"  (1.854 m)   Wt 173 lb (78.5 kg)   BMI 22.82 kg/m   PHYSICAL EXAM:  GEN: Alert and Oriented, NAD, comfortable in exam room RESP: Unlabored respirations, symmetric chest rise PSY: normal mood, congruent affect   LEFT SHOULDER MSK EXAM: No swelling, ecchymoses.  No gross deformity. No TTP. ROM limited to flexion 120d, abduction 90d, ER 45d, IR to buttock.  Positive Hawkins, Neers. Negative Yergasons. Strength 4+/5 with empty can (- pain), 3+/5 with ER (+ Pain), and 5/5 with IR (-pain) NV intact distally. Assessment & Plan 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 Overall doing alright, though plateau of benefit with HEP and PT. Given component of impingement + testing and bursitis I did recommend a cortisone injection for therapeutic benefit while continuing to rehab the shoulder. He was amenable to this. Can ice and use topical arnica as well. Will follow-up in 6 weeks.  Left  Subacromial Injection Procedure: After informed written consent timeout was performed, patient was in seated position on exam table.  Left shoulder was prepped with alcohol swab x2. Ethyl chloride spray used for topical anesthetic. Utilizing the posterior approach and a 25g needle, the left subacromial space was injected with 3:1 lidocaine:depomedrol.  Following the injection a bandage was applied to the area. Patient tolerated procedure well without immediate complications. The patient was counseled as to the expected post-injection course, including the possibility of worsening of pain with steroid flare. Instructed as to concerning symptoms and advised to contact the office if these should arise.  Glean Salen, MD PGY-4, Sports Medicine Fellow [facility]  I observed and examined the patient with the [facility] resident and agree with assessment and plan.  Note reviewed and modified by me. Sterling Big, MD

## 2023-11-28 ENCOUNTER — Ambulatory Visit: Admitting: Sports Medicine

## 2023-12-05 ENCOUNTER — Ambulatory Visit (INDEPENDENT_AMBULATORY_CARE_PROVIDER_SITE_OTHER): Admitting: Sports Medicine

## 2023-12-05 VITALS — BP 140/80 | Ht 73.0 in | Wt 173.0 lb

## 2023-12-05 DIAGNOSIS — M12812 Other specific arthropathies, not elsewhere classified, left shoulder: Secondary | ICD-10-CM | POA: Diagnosis not present

## 2023-12-05 DIAGNOSIS — M75102 Unspecified rotator cuff tear or rupture of left shoulder, not specified as traumatic: Secondary | ICD-10-CM | POA: Diagnosis present

## 2023-12-05 NOTE — Progress Notes (Signed)
 Follow-up of left rotator cuff injury  Patient returns in follow-up saying that he is better with both his motion and strength but still has some symptoms.  He has been very consistent with his home exercises.  Currently his motion is much improved with the exception that reaching behind his back is difficult with the left arm.  No neck pain or radicular symptoms  Physical exam Pleasant white male in no acute distress BP (!) 140/80   Ht 6\' 1"  (1.854 m)   Wt 173 lb (78.5 kg)   BMI 22.82 kg/m   Today his range of motion is full in abduction and forward flexion although he gets a little pain on full abduction above 140 degrees Good internal and external rotation at waist level However he has some difficulty with back scratch on the left and can only reach to the lower lumbar area Strength testing with all rotator cuff muscles was good today Impingement testing with empty can Hawkins and Neer's did not bring out any pain

## 2023-12-05 NOTE — Assessment & Plan Note (Signed)
 While the patient had some distal rotator cuff tearing and 3 of his tendons before he has made steady progress Today his exam shows significantly improved range of motion Excellent strength and no real pain with strength testing against resistance  We discussed that is not surprising with the injury he had that he has some residual pain but fortunately it is now mild Keep doing his home exercises and gradually increase the weight and the degree of elevation with his spokes of the wheel exercise  I would like to see him in 2 months and at that time we will repeat his ultrasound scan to see if he has some healing of the tendons that showed partial tears before

## 2024-01-19 IMAGING — MR MR HEAD W/O CM
10 series · 48 of 48 positions shown · non-contrast
Comparison: None.

CLINICAL DATA: Dizziness, right arm numbness, word-finding
difficulty

EXAM:
MRI HEAD WITHOUT CONTRAST
TECHNIQUE: Multiplanar, multiecho pulse sequences of the brain and surrounding
structures were obtained without intravenous contrast.

[Series 5: DWI · axial · 3.0mm · 1.36mm/px · z∈[-45,+113]mm · 9 of 108 slices shown (1 of 2)]
[im 1/108]
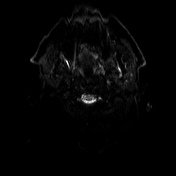
[im 14/108]
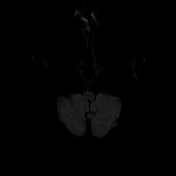
[im 27/108]
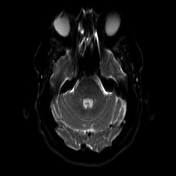
[im 41/108]
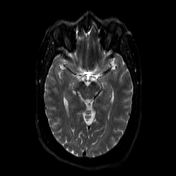
[im 54/108]
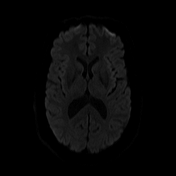
[im 67/108]
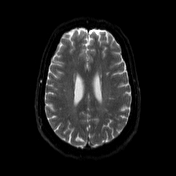
[im 81/108]
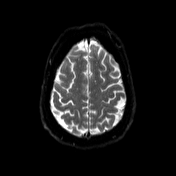
[im 94/108]
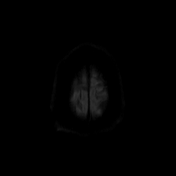
[im 108/108]
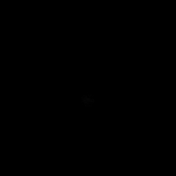

[Series 6: DWI · axial · 3.0mm · 1.36mm/px · z∈[-45,+113]mm · 4 of 54 slices shown (2 of 2)]
[im 1/54]
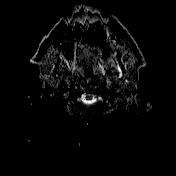
[im 18/54]
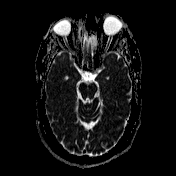
[im 36/54]
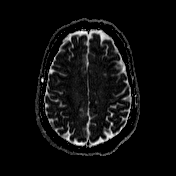
[im 54/54]
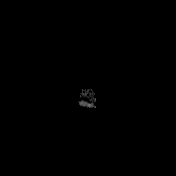

[Series 7: T1 · sagittal · 5.0mm · 0.75mm/px · 2 of 24 slices shown (1 of 2)]
[im 1/24]
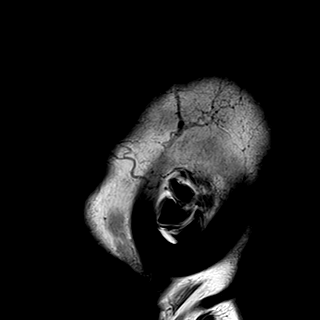
[im 24/24]
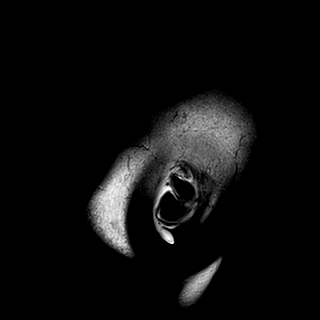

[Series 8: T2 · axial · 5.0mm · 0.62mm/px · z∈[-44,+117]mm · 2 of 26 slices shown (1 of 2)]
[im 1/26]
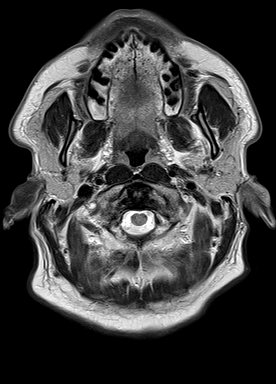
[im 26/26]
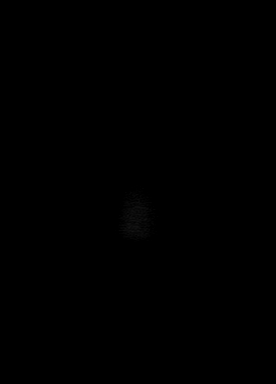

[Series 9: swi_images · axial · 3.0mm · 0.75mm/px · z∈[-53,+123]mm · 5 of 60 slices shown]
[im 1/60]
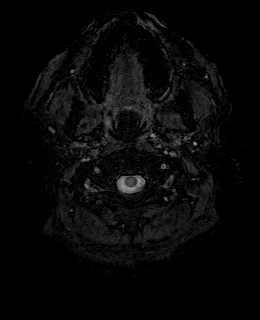
[im 15/60]
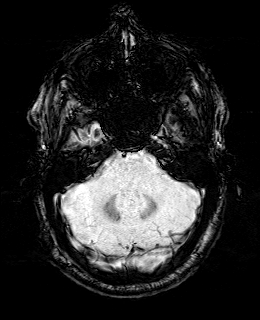
[im 30/60]
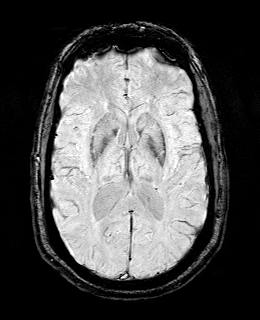
[im 45/60]
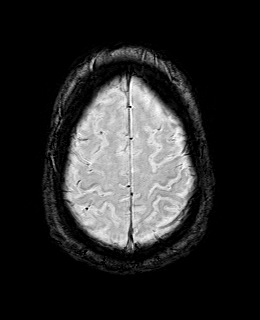
[im 60/60]
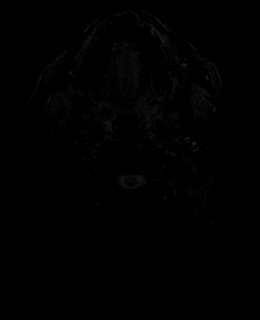

[Series 11: FLAIR · axial · 3.0mm · 0.75mm/px · z∈[-40,+112]mm · 4 of 52 slices shown]
[im 1/52]
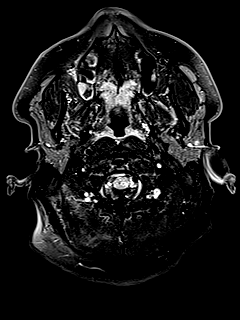
[im 18/52]
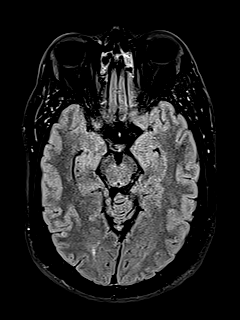
[im 35/52]
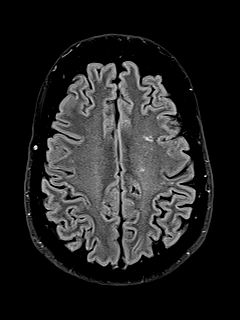
[im 52/52]
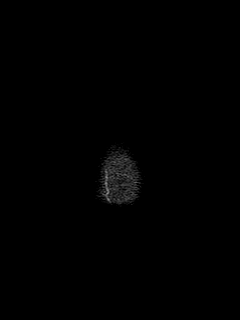

[Series 12: T1 · axial · 1.0mm · 0.94mm/px · z∈[-55,+103]mm · 13 of 160 slices shown (2 of 2)]
[im 1/160]
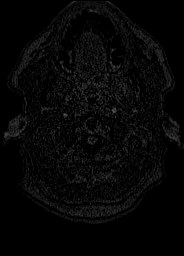
[im 14/160]
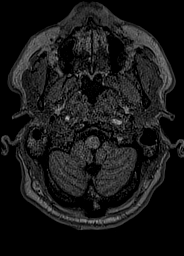
[im 27/160]
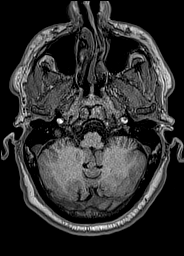
[im 40/160]
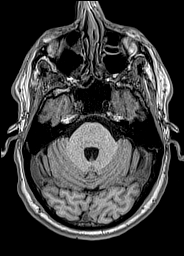
[im 54/160]
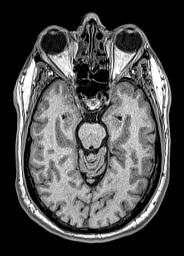
[im 67/160]
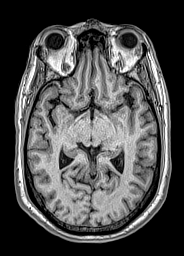
[im 80/160]
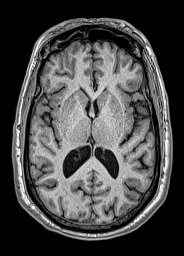
[im 93/160]
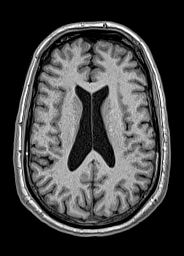
[im 107/160]
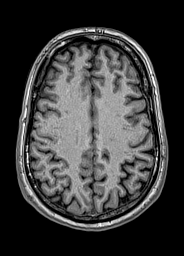
[im 120/160]
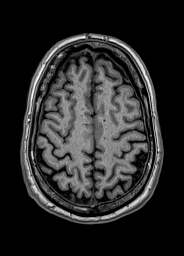
[im 133/160]
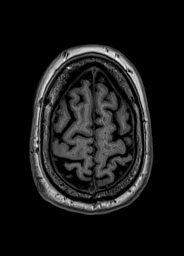
[im 146/160]
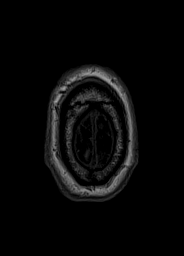
[im 160/160]
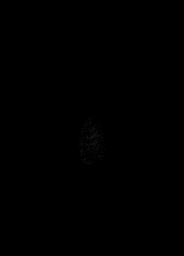

[Series 13: cor dwi_tracew · coronal · 5.0mm · 1.53mm/px · 4 of 54 slices shown]
[im 1/54]
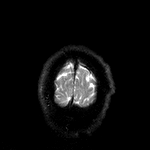
[im 18/54]
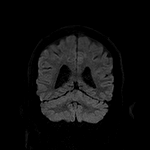
[im 36/54]
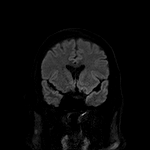
[im 54/54]
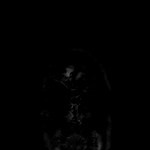

[Series 14: cor dwi_adc · coronal · 5.0mm · 1.53mm/px · 2 of 27 slices shown]
[im 1/27]
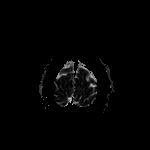
[im 27/27]
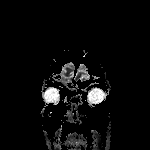

[Series 15: T2 · coronal · 5.0mm · 0.57mm/px · 3 of 35 slices shown (2 of 2)]
[im 1/35]
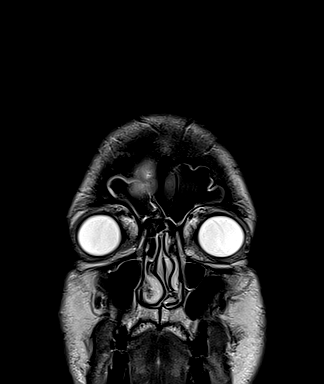
[im 18/35]
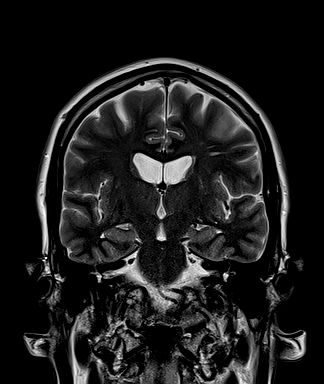
[im 35/35]
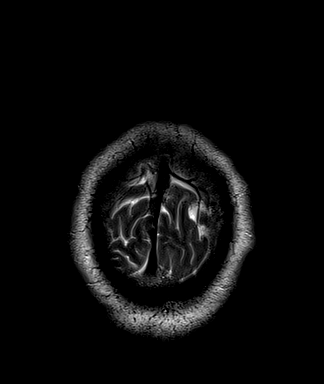

[48 of 48 positions shown; findings below may reference images not displayed]

FINDINGS: Brain: There is no acute intracranial hemorrhage, extra-axial fluid
collection, or acute infarct.

Parenchymal volume is normal. The ventricles are normal in size.
Scattered small foci of FLAIR signal abnormality in the subcortical
and periventricular white matter likely reflects sequela of mild
chronic white matter microangiopathy. There is a small probable
remote lacunar infarct in the left thalamus.

There is no suspicious parenchymal signal abnormality. There is no
mass lesion. There is no mass effect or midline shift.

Vascular: Normal flow voids.

Skull and upper cervical spine: Normal marrow signal.

Sinuses/Orbits: There is mild mucosal thickening in the paranasal
sinuses. The globes and orbits are unremarkable.

Other: None.
IMPRESSION: No acute intracranial pathology.

## 2024-01-29 ENCOUNTER — Ambulatory Visit: Payer: Medicare Other | Admitting: Family Medicine

## 2024-02-04 ENCOUNTER — Ambulatory Visit: Admitting: Sports Medicine

## 2024-02-25 ENCOUNTER — Other Ambulatory Visit: Payer: Self-pay

## 2024-02-25 ENCOUNTER — Ambulatory Visit (INDEPENDENT_AMBULATORY_CARE_PROVIDER_SITE_OTHER): Admitting: Sports Medicine

## 2024-02-25 VITALS — BP 100/64 | Ht 73.0 in | Wt 170.0 lb

## 2024-02-25 DIAGNOSIS — M12812 Other specific arthropathies, not elsewhere classified, left shoulder: Secondary | ICD-10-CM | POA: Diagnosis not present

## 2024-02-25 DIAGNOSIS — M75102 Unspecified rotator cuff tear or rupture of left shoulder, not specified as traumatic: Secondary | ICD-10-CM

## 2024-02-25 NOTE — Progress Notes (Addendum)
 Follow-up of left rotator cuff injury  Patient returns for follow-up stating that he is only slightly improved with his motion and strength.  He still has symptoms.  He notes that he is about 50% better from the initial diagnosis.  He has been consistently doing his home exercises 6 days a week.  He denies any new injuries, falls or trauma.  No neck pain or radicular symptoms  Physical exam Pleasant white male in no acute distress BP 100/64   Ht 6' 1 (1.854 m)   Wt 170 lb (77.1 kg)   BMI 22.43 kg/m   Weakness and pain with empty can test on the left Limited in left flexion about 140 degrees due to pain Limited in left abduction to about 100 degrees due to pain Still has difficulty with back scratch on the left and can reach to lower lumbar area Pain with pushoff test on the left Pain to palpation in the biceps tendon region Negative Yergason's  Ultrasound of left shoulder:   Biceps Tendon SAX and LAX: visualized in bicipital groove w/ trace intra tendinous hypoechoic changes but no hypoechoic fluid surrounding the tendon, IR/ER does not demonstrate popping of the tendon out of the groove Subscapularis tendon - viewed in SAX and LAX inserting into the inferior lesser tubercle of humerus. echogenics: hypoechoic changes within the tendon confirmed in both views AC joint - no arthritis noted osteophyte formation, no giser sign Supraspinatus tendon - viewed in SAX and LAX, tendon in tact, insertion at superior facet of greater tubercle of the humerus w/ echogenics: hypoechoic changes within the tendon confirmed in both views - tear measuring 0.76 cm Infraspinatus and Teres minor tendons - viewed in LAX and SAX at the middle facet of the greater tubercle of humerus w/ echogenics  Impression: Small tear of supraspinatus tendon left  Ultrasound and interpretation by Dr. Jilda and Helene NOVAK. Fields, MD   - Discussed with patient that he should continue his home exercise regimen. -Tear  noted of supraspinatus 0.76 cm -With continued rehab, this may improve with time -Overall patient has very little symptoms with his day-to-day activities -Will repeat ultrasound testing on next visit

## 2024-04-28 ENCOUNTER — Other Ambulatory Visit: Payer: Self-pay

## 2024-04-28 ENCOUNTER — Ambulatory Visit (INDEPENDENT_AMBULATORY_CARE_PROVIDER_SITE_OTHER): Admitting: Sports Medicine

## 2024-04-28 VITALS — BP 118/80 | Ht 73.0 in | Wt 170.0 lb

## 2024-04-28 DIAGNOSIS — M75102 Unspecified rotator cuff tear or rupture of left shoulder, not specified as traumatic: Secondary | ICD-10-CM | POA: Diagnosis not present

## 2024-04-28 DIAGNOSIS — M25512 Pain in left shoulder: Secondary | ICD-10-CM

## 2024-04-28 NOTE — Progress Notes (Signed)
  PCP: Onita Rush, MD  Subjective:   HPI: Patient is a 68 y.o. male here for follow-up of left rotator cuff injury.  Patient has been consistently doing his home exercises and notes that he is noticing improved range of motion and strength.  He denies any new injuries.  Overall, he feels quite reassured that he is improving and his strength and range of motion.  He denies any radicular symptoms.   Past Medical History:  Diagnosis Date   Cancer (HCC) 2012   basal cell ca on nose   Hernia    Hyperlipidemia    Obstructive apnea    CPAP    Ulcer     No current outpatient medications on file prior to visit.   No current facility-administered medications on file prior to visit.    BP 118/80   Ht 6' 1 (1.854 m)   Wt 170 lb (77.1 kg)   BMI 22.43 kg/m   Objective:   Physical Exam:  Gen: NAD, comfortable in exam room Left shoulder Inspection: No erythema, edema or warmth Palpation: No significant pain to palpation ROM: Shoulder flexion to about 150 degrees, abduction to about 110 degrees Special Tests: Pain with empty can test although strength is equal, pain with pushoff test, negative Yergason's, negative speeds, negative O'Brien's Neuro: Rotator cuff strength is equal bilaterally with abduction, internal and external rotation, sensation is intact distally  MSK Complete US  of Shoulder Date: 04/28/2024 Location: Left shoulder Indication: Left supraspinatus tear Findings: Patient was seated on exam table and shoulder US  examination was performed using high frequency linear probe.  - Biceps tendon no signs of hypoechoic changes.  Tendon intact - Subscapularis tendon was visualized in both longitudinal and transverse axis with intact tendon -  Supraspinatus tendon was visualized in longitudinal, transverse, and dynamic views.  Mild hypoechoic change with tear evidence improving compared to previous - Infraspinatus and teres minor tendons -no signs of tearing or hypoechoic  change - AC Joint -no significant arthritis  IMPRESSION:  Mild improvement of supraspinatus tendon tear  Ultrasound and interpretation by Helene NOVAK. Fields, MD and Jacob Lowing, DO  Assessment/Plan:   Jacob Lozano is a 68 y.o. male who was seen today for the following: 1. Pain in joint of left shoulder (Primary) 2. Tear of left supraspinatus tendon - US  LIMITED JOINT SPACE STRUCTURES UP LEFT; Future - Patient strength and range of motion has improved - Overall he is pleased with how he is progressing - Ultrasound showing some healing evidence of his supraspinatus tear - Patient should continue to do his exercises - Encouraged him to increase his weight from 3 to 5 pounds to attempt to complete full range of motion abduction with increased weight - Precautions reinstated that if he has pain with this weight, to come down - Follow-up in 2 months  Follow-up/Education:   Return in about 2 months (around 06/28/2024).   May return sooner as needed and encouraged to call/e-mail for additional questions or  worsening symptoms in the interim.  Jacob Lowing, DO Sports Medicine Fellow 04/28/2024 11:20 AM

## 2024-05-18 ENCOUNTER — Telehealth: Payer: Self-pay | Admitting: Family Medicine

## 2024-05-18 NOTE — Telephone Encounter (Signed)
 LVM and sent mychart msg informing pt of need to reschedule 09/17/24 appt - NP schedule change

## 2024-06-30 ENCOUNTER — Encounter: Payer: Self-pay | Admitting: Sports Medicine

## 2024-06-30 ENCOUNTER — Ambulatory Visit (INDEPENDENT_AMBULATORY_CARE_PROVIDER_SITE_OTHER): Admitting: Sports Medicine

## 2024-06-30 VITALS — BP 126/84 | Ht 73.0 in | Wt 172.5 lb

## 2024-06-30 DIAGNOSIS — M25512 Pain in left shoulder: Secondary | ICD-10-CM

## 2024-06-30 DIAGNOSIS — M75102 Unspecified rotator cuff tear or rupture of left shoulder, not specified as traumatic: Secondary | ICD-10-CM | POA: Diagnosis not present

## 2024-06-30 NOTE — Progress Notes (Signed)
 [facility] Department of [facility] [place]   PCP: Onita Rush, MD  CHIEF COMPLAINT: Follow-up for left shoulder pain  HPI: Patient is a pleasant 68 y.o. male who presents today for for follow-up regarding left shoulder pain with partial tearing of supraspinatus tendon.  Patient has been doing well and progressively increasing with his weights for rotator cuff rehab.  Very diligent about doing exercises daily.  Says he is able to complete all of his activities of daily living without any functional or pain limitations.  Does still occasionally gets pain with full abduction of shoulders or when trying to reach behind himself, but he feels this is manageable.  Besides rehab, patient had briefly tried nitroglycerin  patches but found they made him feel bad so he stopped almost immediately.  Patient pleased with results of rehab.  No specific concerns or complaints at today's office visit.   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 07/24/2023   Tibialis posterior tendinopathy 08/02/2022   Dry eye syndrome of both eyes 07/10/2022   Impingement syndrome of right shoulder 04/05/2020   Metatarsalgia of right foot 11/21/2017   Patellar tendinitis of both knees 11/21/2017   Pes anserine bursitis 08/23/2016   Arthralgia of right acromioclavicular joint 08/23/2016   Left Achilles tendinitis 01/18/2016   Medial epicondylitis of right elbow 07/18/2015   OSA on CPAP 03/03/2014   Pain in joint, shoulder region 06/03/2013   Left knee pain 05/19/2013   Sleep apnea with use of continuous positive airway pressure (CPAP) 02/26/2013   Obstructive apnea    Perineum pain, male 10/02/2011   HYPERLIPIDEMIA 01/27/2009   Peptic ulcer 01/27/2009    BACK PAIN, LUMBAR 01/27/2009    PSurg:  Past Surgical History:  Procedure Laterality Date   APPENDECTOMY  2010   HERNIA REPAIR  03/22/2011   Lap bilateral obturator & femoral hernia repairs    Allergies: Patient has no known allergies.  Meds:  Previous Medications   No medications on file    Social:  Social History   Tobacco Use   Smoking status: Never   Smokeless tobacco: Never  Substance Use Topics   Alcohol  use: Yes    Alcohol /week: 0.0 standard drinks of alcohol     REVIEW OF SYSTEMS:  ROS negative except as noted in HPI above   Objective Exam:  Vitals:   06/30/24 0949  BP: 126/84  Weight: 172 lb 8 oz (78.2 kg)  Height: 6' 1 (1.854 m)    GENERAL: Patient is afebrile, Vital signs reviewed, well appearing, Patient appears comfortable, Alert and lucid. No apparent distress.   Physical Exam   Ortho Exam:  On inspection of left shoulder no evidence of erythema, ecchymosis, or edema present.  Denies any tenderness to palpation over bony landmarks of shoulder including but not limited to [facility] joint, acromion, and bicipital groove of humerus.  Patient has full active/passive range of motion of shoulder in flexion, extension, adduction, internal and external rotation.  Limited range of motion in full abduction, 150 degree.  Rotator cuff strength testing is 5/5 bilaterally in all planes of motion with the exception of left resisted abduction which is 4/5.  Neurovascularly intact distally with no radiation of pain into forearms/hands.  Empty can testing negative for weakness, mildly positive for pain.  O'Brien's negative.  Hawkins mildly positive.  Neer's negative.  Apprehension test negative.  Cross body adduction testing negative.   RESULTS:  Labs: No results found for this or any previous visit (from the past 48 hours).  Imaging:  No orders to display    Assessment/Plan:  1. Pain in joint of left shoulder   2. Tear of left supraspinatus tendon   -patient pleased  with progress of rehab; states he doesn't even noticed he has any shoulder issues >90% of the time -patient desires to continue HEP with no further intervention at this time. Plans to complete exercises >3x/week (states 6 day/week) -will follow back up as needed if symptoms change    New Prescriptions   No medications on file    Medications, medical history, allergies, surgical history, hospitalizations, family history, social history, ROS and vitals entered by nursing staff and reviewed by myself.  I discussed with the patient the diagnosis, treatment plan, indications for return to the emergency department, and for expected follow-up. The patient verbalized an understanding. The patient is asked if there are any questions or concerns. We discuss the case, until all issues are addressed to the patient's satisfaction.  Follow up per instructions including returning for additional office visit if symptoms worsen or proceeding to the emergency department or urgent care in the next 12-24hrs if there is an acute concerning increasing symptoms, pain, fevers, or other symptoms.  Prentice Agent, DO  10:00 PM, 06/30/2024

## 2024-08-25 ENCOUNTER — Ambulatory Visit: Admitting: Family Medicine

## 2024-08-25 ENCOUNTER — Telehealth: Payer: Self-pay

## 2024-08-25 ENCOUNTER — Encounter: Payer: Self-pay | Admitting: Family Medicine

## 2024-08-25 VITALS — BP 130/76 | HR 92 | Resp 15 | Ht 73.0 in | Wt 182.5 lb

## 2024-08-25 DIAGNOSIS — G4733 Obstructive sleep apnea (adult) (pediatric): Secondary | ICD-10-CM

## 2024-08-25 NOTE — Telephone Encounter (Signed)
 Order has been sent to dme D[facility]  [phone]

## 2024-08-25 NOTE — Progress Notes (Signed)
 Jacob Lozano
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
## 2024-08-26 NOTE — Telephone Encounter (Signed)
 Jacob Lozano

## 2024-09-17 ENCOUNTER — Ambulatory Visit: Payer: Medicare Other | Admitting: Family Medicine

## 2025-09-06 ENCOUNTER — Ambulatory Visit: Admitting: Family Medicine
